# Patient Record
Sex: Male | Born: 1990 | Race: White | Hispanic: No | Marital: Married | Smoking: Current every day smoker
Health system: Southern US, Community
[De-identification: ages and names within clinical notes are randomized; demographics above are authoritative.]

## PROBLEM LIST (undated history)

## (undated) DIAGNOSIS — R569 Unspecified convulsions: Secondary | ICD-10-CM

## (undated) DIAGNOSIS — F329 Major depressive disorder, single episode, unspecified: Secondary | ICD-10-CM

## (undated) DIAGNOSIS — F419 Anxiety disorder, unspecified: Secondary | ICD-10-CM

## (undated) DIAGNOSIS — F1920 Other psychoactive substance dependence, uncomplicated: Secondary | ICD-10-CM

## (undated) DIAGNOSIS — F32A Depression, unspecified: Secondary | ICD-10-CM

## (undated) HISTORY — DX: Depression, unspecified: F32.A

## (undated) HISTORY — DX: Anxiety disorder, unspecified: F41.9

## (undated) HISTORY — DX: Other psychoactive substance dependence, uncomplicated: F19.20

## (undated) HISTORY — DX: Unspecified convulsions: R56.9

## (undated) HISTORY — DX: Major depressive disorder, single episode, unspecified: F32.9

---
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
## 2008-02-19 ENCOUNTER — Ambulatory Visit (HOSPITAL_COMMUNITY): Admission: RE | Admit: 2008-02-19 | Discharge: 2008-02-19 | Payer: Self-pay | Admitting: Family Medicine

## 2009-01-26 ENCOUNTER — Inpatient Hospital Stay (HOSPITAL_COMMUNITY): Admission: EM | Admit: 2009-01-26 | Discharge: 2009-01-28 | Payer: Self-pay | Admitting: Emergency Medicine

## 2010-04-08 ENCOUNTER — Ambulatory Visit (HOSPITAL_COMMUNITY): Admission: RE | Admit: 2010-04-08 | Discharge: 2010-04-08 | Payer: Self-pay | Admitting: Family Medicine

## 2010-07-02 ENCOUNTER — Emergency Department (HOSPITAL_COMMUNITY): Admission: EM | Admit: 2010-07-02 | Discharge: 2010-07-02 | Payer: Self-pay | Admitting: Emergency Medicine

## 2010-10-25 DIAGNOSIS — F1920 Other psychoactive substance dependence, uncomplicated: Secondary | ICD-10-CM

## 2010-10-25 HISTORY — DX: Other psychoactive substance dependence, uncomplicated: F19.20

## 2010-11-27 ENCOUNTER — Emergency Department (HOSPITAL_COMMUNITY)
Admission: EM | Admit: 2010-11-27 | Discharge: 2010-11-27 | Discharge status: Against Medical Advice | Payer: Self-pay | Attending: Emergency Medicine | Admitting: Emergency Medicine

## 2010-11-27 DIAGNOSIS — F1910 Other psychoactive substance abuse, uncomplicated: Secondary | ICD-10-CM | POA: Insufficient documentation

## 2010-11-27 DIAGNOSIS — F329 Major depressive disorder, single episode, unspecified: Secondary | ICD-10-CM | POA: Insufficient documentation

## 2010-11-27 DIAGNOSIS — G40909 Epilepsy, unspecified, not intractable, without status epilepticus: Secondary | ICD-10-CM | POA: Insufficient documentation

## 2010-11-27 DIAGNOSIS — F3289 Other specified depressive episodes: Secondary | ICD-10-CM | POA: Insufficient documentation

## 2010-11-27 DIAGNOSIS — F1110 Opioid abuse, uncomplicated: Secondary | ICD-10-CM | POA: Insufficient documentation

## 2010-11-27 LAB — CBC
MCH: 29.8 pg (ref 26.0–34.0)
MCHC: 34.2 g/dL (ref 30.0–36.0)
Platelets: 269 10*3/uL (ref 150–400)
RBC: 4.63 MIL/uL (ref 4.22–5.81)
RDW: 12.3 % (ref 11.5–15.5)

## 2010-11-27 LAB — RAPID URINE DRUG SCREEN, HOSP PERFORMED
Amphetamines: NOT DETECTED
Barbiturates: NOT DETECTED
Benzodiazepines: POSITIVE — AB
Opiates: POSITIVE — AB

## 2010-11-27 LAB — DIFFERENTIAL
Basophils Relative: 1 % (ref 0–1)
Eosinophils Absolute: 0.5 10*3/uL (ref 0.0–0.7)
Eosinophils Relative: 6 % — ABNORMAL HIGH (ref 0–5)
Monocytes Absolute: 0.6 10*3/uL (ref 0.1–1.0)
Monocytes Relative: 8 % (ref 3–12)
Neutrophils Relative %: 63 % (ref 43–77)

## 2010-11-27 LAB — COMPREHENSIVE METABOLIC PANEL
Albumin: 4.2 g/dL (ref 3.5–5.2)
BUN: 12 mg/dL (ref 6–23)
Creatinine, Ser: 0.88 mg/dL (ref 0.4–1.5)
Total Protein: 7.1 g/dL (ref 6.0–8.3)

## 2010-11-27 LAB — ACETAMINOPHEN LEVEL: Acetaminophen (Tylenol), Serum: <10 ug/mL — ABNORMAL LOW (ref 10–30)

## 2010-11-27 LAB — ETHANOL: Alcohol, Ethyl (B): <5 mg/dL (ref 0–10)

## 2010-11-30 ENCOUNTER — Emergency Department (HOSPITAL_COMMUNITY)
Admission: EM | Admit: 2010-11-30 | Discharge: 2010-12-01 | Disposition: A | Discharge status: Home / Self Care | Payer: Self-pay | Attending: Emergency Medicine | Admitting: Emergency Medicine

## 2010-11-30 DIAGNOSIS — F1110 Opioid abuse, uncomplicated: Secondary | ICD-10-CM | POA: Insufficient documentation

## 2010-11-30 DIAGNOSIS — F17200 Nicotine dependence, unspecified, uncomplicated: Secondary | ICD-10-CM | POA: Insufficient documentation

## 2010-11-30 DIAGNOSIS — G40909 Epilepsy, unspecified, not intractable, without status epilepticus: Secondary | ICD-10-CM | POA: Insufficient documentation

## 2010-11-30 DIAGNOSIS — F1410 Cocaine abuse, uncomplicated: Secondary | ICD-10-CM | POA: Insufficient documentation

## 2010-11-30 LAB — URINALYSIS, ROUTINE W REFLEX MICROSCOPIC
Nitrite: NEGATIVE
Specific Gravity, Urine: 1.042 — ABNORMAL HIGH (ref 1.005–1.030)
Urobilinogen, UA: 0.2 mg/dL (ref 0.0–1.0)
pH: 5.5 (ref 5.0–8.0)

## 2010-11-30 LAB — RAPID URINE DRUG SCREEN, HOSP PERFORMED
Amphetamines: NOT DETECTED
Cocaine: POSITIVE — AB
Tetrahydrocannabinol: NOT DETECTED

## 2010-12-01 LAB — DIFFERENTIAL
Basophils Absolute: 0 10*3/uL (ref 0.0–0.1)
Basophils Relative: 1 % (ref 0–1)
Eosinophils Relative: 5 % (ref 0–5)
Lymphocytes Relative: 24 % (ref 12–46)
Monocytes Absolute: 0.6 10*3/uL (ref 0.1–1.0)

## 2010-12-01 LAB — COMPREHENSIVE METABOLIC PANEL
ALT: 13 U/L (ref 0–53)
AST: 22 U/L (ref 0–37)
Albumin: 4.1 g/dL (ref 3.5–5.2)
Alkaline Phosphatase: 58 U/L (ref 39–117)
Calcium: 9.5 mg/dL (ref 8.4–10.5)
GFR calc Af Amer: >60 mL/min (ref >60)
Glucose, Bld: 113 mg/dL — ABNORMAL HIGH (ref 70–99)
Potassium: 3.6 mEq/L (ref 3.5–5.1)
Sodium: 137 mEq/L (ref 135–145)
Total Protein: 7.2 g/dL (ref 6.0–8.3)

## 2010-12-01 LAB — CBC
HCT: 38.3 % — ABNORMAL LOW (ref 39.0–52.0)
MCHC: 35.2 g/dL (ref 30.0–36.0)
Platelets: 292 10*3/uL (ref 150–400)
RDW: 12.2 % (ref 11.5–15.5)
WBC: 6.3 10*3/uL (ref 4.0–10.5)

## 2010-12-16 ENCOUNTER — Emergency Department (HOSPITAL_COMMUNITY)
Admission: EM | Admit: 2010-12-16 | Discharge: 2010-12-17 | Disposition: A | Discharge status: Home / Self Care | Payer: Self-pay | Attending: Emergency Medicine | Admitting: Emergency Medicine

## 2010-12-16 DIAGNOSIS — F1410 Cocaine abuse, uncomplicated: Secondary | ICD-10-CM | POA: Insufficient documentation

## 2010-12-16 LAB — CBC
HCT: 40.4 % (ref 39.0–52.0)
MCH: 30.3 pg (ref 26.0–34.0)
MCV: 88.6 fL (ref 78.0–100.0)
Platelets: 292 10*3/uL (ref 150–400)
RDW: 12.2 % (ref 11.5–15.5)
WBC: 12.1 10*3/uL — ABNORMAL HIGH (ref 4.0–10.5)

## 2010-12-16 LAB — COMPREHENSIVE METABOLIC PANEL
Albumin: 4.4 g/dL (ref 3.5–5.2)
Alkaline Phosphatase: 53 U/L (ref 39–117)
BUN: 11 mg/dL (ref 6–23)
Chloride: 100 mEq/L (ref 96–112)
Creatinine, Ser: 0.78 mg/dL (ref 0.4–1.5)
Glucose, Bld: 104 mg/dL — ABNORMAL HIGH (ref 70–99)
Potassium: 3 mEq/L — ABNORMAL LOW (ref 3.5–5.1)
Total Bilirubin: 0.5 mg/dL (ref 0.3–1.2)
Total Protein: 7.6 g/dL (ref 6.0–8.3)

## 2010-12-16 LAB — DIFFERENTIAL
Eosinophils Absolute: 0.6 10*3/uL (ref 0.0–0.7)
Eosinophils Relative: 5 % (ref 0–5)
Lymphocytes Relative: 18 % (ref 12–46)
Lymphs Abs: 2.2 10*3/uL (ref 0.7–4.0)
Monocytes Relative: 8 % (ref 3–12)

## 2010-12-16 LAB — ETHANOL: Alcohol, Ethyl (B): <5 mg/dL (ref 0–10)

## 2010-12-16 LAB — RAPID URINE DRUG SCREEN, HOSP PERFORMED
Barbiturates: NOT DETECTED
Opiates: NOT DETECTED

## 2011-02-03 LAB — COMPREHENSIVE METABOLIC PANEL
Alkaline Phosphatase: 69 U/L (ref 39–117)
BUN: 14 mg/dL (ref 6–23)
CO2: 16 mEq/L — ABNORMAL LOW (ref 19–32)
Chloride: 104 mEq/L (ref 96–112)
Creatinine, Ser: 1.05 mg/dL (ref 0.4–1.5)
GFR calc non Af Amer: >60 mL/min (ref >60)
Glucose, Bld: 119 mg/dL — ABNORMAL HIGH (ref 70–99)
Potassium: 3.1 mEq/L — ABNORMAL LOW (ref 3.5–5.1)
Total Bilirubin: 1 mg/dL (ref 0.3–1.2)

## 2011-02-03 LAB — BASIC METABOLIC PANEL
BUN: 8 mg/dL (ref 6–23)
CO2: 25 mEq/L (ref 19–32)
Chloride: 105 mEq/L (ref 96–112)
Glucose, Bld: 107 mg/dL — ABNORMAL HIGH (ref 70–99)
Potassium: 3.9 mEq/L (ref 3.5–5.1)
Sodium: 137 mEq/L (ref 135–145)

## 2011-02-03 LAB — DIFFERENTIAL
Basophils Absolute: 0.1 10*3/uL (ref 0.0–0.1)
Basophils Relative: 0 % (ref 0–1)
Monocytes Absolute: 1.5 10*3/uL — ABNORMAL HIGH (ref 0.1–1.0)
Neutro Abs: 6.6 10*3/uL (ref 1.7–7.7)
Neutrophils Relative %: 50 % (ref 43–77)

## 2011-02-03 LAB — RAPID URINE DRUG SCREEN, HOSP PERFORMED
Amphetamines: NOT DETECTED
Barbiturates: NOT DETECTED
Benzodiazepines: POSITIVE — AB
Cocaine: NOT DETECTED
Opiates: POSITIVE — AB

## 2011-02-03 LAB — URINALYSIS, ROUTINE W REFLEX MICROSCOPIC
Bilirubin Urine: NEGATIVE
Hgb urine dipstick: NEGATIVE
Ketones, ur: NEGATIVE mg/dL
Nitrite: NEGATIVE
pH: 5.5 (ref 5.0–8.0)

## 2011-02-03 LAB — SALICYLATE LEVEL: Salicylate Lvl: <4 mg/dL (ref 2.8–20.0)

## 2011-02-03 LAB — ACETAMINOPHEN LEVEL: Acetaminophen (Tylenol), Serum: <10 ug/mL — ABNORMAL LOW (ref 10–30)

## 2011-02-03 LAB — CBC
HCT: 48.1 % (ref 39.0–52.0)
Hemoglobin: 16.3 g/dL (ref 13.0–17.0)
MCV: 93.1 fL (ref 78.0–100.0)
WBC: 13.3 10*3/uL — ABNORMAL HIGH (ref 4.0–10.5)

## 2011-02-03 LAB — MISCELLANEOUS TEST

## 2011-02-03 LAB — CK: Total CK: 137 U/L (ref 7–232)

## 2011-02-03 LAB — GLUCOSE, CAPILLARY

## 2011-03-09 NOTE — Consult Note (Signed)
NAME:  [name], [name]                ACCOUNT NO.:  [id number]   MEDICAL RECORD NO.:  [id number]          PATIENT TYPE:  INP   LOCATION:  A336                          FACILITY:  [facility]   PHYSICIAN:  Kofi A. Gerilyn Pilgrim, M.D. DATE OF BIRTH:  1991-08-19   DATE OF CONSULTATION:  DATE OF DISCHARGE:                                 CONSULTATION   REASON FOR CONSULTATION:  Altered mental status.   The patient is a 20 year old white male who resides at home with his  grandmother.  The grandmother indicates over the past 2 months he has  been having what appears to be isolated body twitches occurring  episodically involving different parts of the body.  The description  seems to be consistent with twitches, although certainly myoclonic  twitches may be a possibility.  The patient had what appeared to be a  generalization tonic-clonic activity while playing a video game with a  friend last night.  The event was preceded by myoclonic jerking twitches  as he has had over the past 2 months.  He then had generalized shaking  event, probably about one or two events, both at home and on the way to  the emergency room.  He did bite the left side of his tongue.  There is  no family history of seizure or epilepsy.  The patient does not have a  history of CNS infection or head injury.  He was born full-term by  vaginal delivery, although he is mother did use drugs during the  pregnancy.  No history of fetal infection.   PAST MEDICAL HISTORY:  Unremarkable.   MEDICATIONS:  None.   SOCIAL HISTORY:  He resides with his grandmother.  His natural parents  apparently had problems with the law and have been in and out of prison.  He drinks occasionally.  Apparently, no history of drug abuse, although  his urine drug screen is positive for marijuana.  He is a tobacco user.  He smokes cigarettes.  Currently a Consulting civil engineer.   ALLERGIES:  NONE KNOWN.   REVIEW OF SYSTEMS:  As stated in the history of present illness.   The  patient reports significant right shoulder pain.  He injured it during  the seizure.  He also complains of a superficial laceration involving  the right lateral thigh.   PHYSICAL EXAMINATION:  Shows average weight man.  He is no acute  distress.  He is sleeping.  Temperature 97.7, pulse 57, respirations 20, blood pressure 127/64.  HEENT EVALUATION:  There is a healing bruise/bite mark involving the  right side of the tongue.  NECK:  Supple.  ABDOMEN:  Soft.  EXTREMITIES:  No significant edema.  There is a superficial laceration  involving the lateral aspect of the right thigh.  MENTATION:  The patient is sleeping, but he easily arousable.  He is  awake and alert.  He is converses well.  Speech, language and cognition  are intact.  CRANIAL NERVE EVALUATION:  Pupils are large 7 mm, but briskly reactive.  Visual fields are intact.  Extraocular movements are full.  Facial  muscle  strength is symmetric.  Tongue midline.  Uvula midline.  Shoulder  shrugs, slightly reduced on the right due to pain.  MOTOR EXAMINATION:  Shows the proximal weakness of the right upper  extremity, the right shoulder again, mostly limited to pain.  The other  muscle groups involving the other extremities shows normal tone, bulk  and strength.  Coordination shows no dysmetria, no tremors, no passed  pointing.  The reflexes are preserved.  Plantar reflexes are both  downgoing.  Sensation is normal to temperature and light touch.   Head CT scan of brain is normal.  Urine drug screen is positive for  opioids, benzodiazepines and cannabinoid.  Sodium 143, potassium 3.1,  chloride 104, CO2 of 60, glucose 119, BUN 14, creatinine 1, calcium 10.  Liver enzymes are normal.  CPK 137.  Acetaminophen, salicylates both  undetected.  Alcohol undetected.  Lactic acid normal range of 1.7.  WBC  13.3, platelet count 281, hemoglobin 16.  The differential 50%  neutrophils, monocytes 11%, eosinophils are slightly high at  1.2,  basophils 9.   IMPRESSION:  New onset of seizures.  Given the recurrent events of  myoclonic jerk, I suspect that the patient may turn out to be epileptic  and may need long-term antiepileptic medication.  The myoclonic jerk  raises the possibility of diurnal myoclonic epilepsy syndrome.  The EEG  will help determine this.  If this is the case, then we will most likely  need to which his medication to a more appropriate therapy such as  Depakote.  The urine drug screen positive for benzodiazepine also raises  the possibility of benzodiazepine withdrawal seizures.   RECOMMENDATIONS:  1. MRI of brain.  2. EEG.  3. Further recommendations will follow depending on the workup,      particularly the EEG.  We will decide if his Dilantin is indeed the      most appropriate medication which has been started.   Thank you for this consultation.      Kofi A. Gerilyn Pilgrim, M.D.  Electronically Signed     KAD/MEDQ  D:  01/27/2009  T:  01/27/2009  Job:  [id number]

## 2011-03-09 NOTE — Group Therapy Note (Signed)
NAME:  [name], KEETCH                ACCOUNT NO.:  [id number]   MEDICAL RECORD NO.:  [id number]          PATIENT TYPE:  INP   LOCATION:  A336                          FACILITY:  [facility]   PHYSICIAN:  Kofi A. Gerilyn Pilgrim, M.D. DATE OF BIRTH:  05/13/1991   DATE OF PROCEDURE:  DATE OF DISCHARGE:  01/28/2009                                 PROGRESS NOTE   The patient continues to have myoclonic jerks.  Temperature 97.3, pulse  55, respirations 20, blood pressure 120/75.  He is awake, alert, and  feels hot.  He still complains of significant left shoulder soreness.  MRI of the brain was reviewed in person and shows no significant  mesiotemporal sclerosis, otherwise unrevealing.  EEG has not been done.   ASSESSMENT AND PLAN:  Likely new-onset seizure with increased risk of  recurrent events.  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  He has been placed on Dilantin.  For now,  we will continue this until EEG has been obtained.  Seizure precaution  was discussed at length with the patient.      Kofi A. Gerilyn Pilgrim, M.D.  Electronically Signed     KAD/MEDQ  D:  01/28/2009  T:  01/29/2009  Job:  [id number]

## 2011-03-09 NOTE — H&P (Signed)
[name], [name]                ACCOUNT NO.:  [id number]   MEDICAL RECORD NO.:  [id number]          PATIENT TYPE:  INP   LOCATION:  A336                          FACILITY:  [facility]   PHYSICIAN:  Donna Bernard, M.D.DATE OF BIRTH:  1991-04-06   DATE OF ADMISSION:  01/26/2009  DATE OF DISCHARGE:  LH                              HISTORY & PHYSICAL   CHIEF COMPLAINT:  Possible seizure.   SUBJECTIVE:  This patient is a 20-1/22-year-[age] male with a history of an  acute event and brought into the emergency room.  He was walking in the  house when his legs seems to give away and knee collapsed towards the  floor and he slowly picked himself up, and a couple minutes later he  collapsed again, he started speaking in a bit of slurred speech.  He  seemed to be a bit disoriented, then he proceeded to go unconscious.  At  this time, his arms and legs were started jerking.  He bit his tongue.  He did not urinate on himself, relatives threw him in a vehicle and  brought him to the emergency room.  He was still seizing.  When he  arrived to the ER, he was given IV Ativan.  He developed postictal  confusion and mental status changes and hypersomnia, which gradually  resolved over 20-30 minutes post event.  The patient has no personal  history of seizures.  There was some history of drug abuse with mother  in the past.   CURRENT MEDICATIONS:  None.   SOCIAL HISTORY:  The patient is single, lives at home, and did get his  degree.  Currently, he is not driving secondary to DUI.  Currently, he  is not working.  He does smoke cigarettes.  He admits to no drug use,  however, when we want him know there is drug screen that was positive in  multiple regards, he backed up a bit on this.   PRIOR HOSPITALIZATIONS:  No prior hospitalizations.   PRIOR SURGERIES:  No prior surgeries.   PRIOR MEDICAL HISTORY:  Significant for depression, anxiety, and for  ADHD.   REVIEW OF SYSTEMS:  Otherwise  negative.   PHYSICAL EXAMINATION:  VITAL SIGNS:  Blood pressure 118/70, afebrile.  GENERAL:  The patient is alert, no acute distress, some aching and pain  when he moves his extremities about.  HEENT:  Right lateral tongue has been bit upon.  Fundi disc sharp.  TMs  normal.  NECK:  Supple.  LUNGS:  Clear.  HEART:  Regular rhythm  EXTREMITIES:  Shoulders pain with rotation bilaterally right greater  than left.  ABDOMEN:  Benign.  No rebound or guarding.  NEUROLOGIC:  Intact.  There is occasional twitching of one arm or the  other during the exam.  Then history, family notes the patient has had  some twitching of extremities intermittently in recent months.   Significant labs, CT scan of the brain was negative.  Alcohol level,  salicylate level, and acetaminophen level negative.  Cardiac enzymes  normal.  Urine drug screen, however, was positive for opiates,  marijuana, and benzodiazepines.   IMPRESSION:  1. Probable seizure.  2. Drug abuse.  Discussed with family.  3. History of depression and anxiety.  The patient states he has been      feeling down.  No suicidal thoughts, but admits to feeling down of      late.  4. Hypokalemia.   PLAN:  Admit for evaluation, Neurology consult, IV fluids.  Further  orders as noted in the chart.  We will go ahead and load orally with  Dilantin due to neurological symptomatology in the last several months.      Donna Bernard, M.D.  Electronically Signed     WSL/MEDQ  D:  01/27/2009  T:  01/28/2009  Job:  [id number]

## 2011-03-12 NOTE — Discharge Summary (Signed)
NAMEORAN, DILLENBURG                ACCOUNT NO.:  [id number]   MEDICAL RECORD NO.:  [id number]          PATIENT TYPE:  INP   LOCATION:  A336                          FACILITY:  [facility]   PHYSICIAN:  Donna Bernard, M.D.DATE OF BIRTH:  1991-07-23   DATE OF ADMISSION:  01/26/2009  DATE OF DISCHARGE:  04/06/2010LH                               DISCHARGE SUMMARY   FINAL DIAGNOSES:  1. Seizure disorder.  2. History of depression.  3. History of attention deficit hyperactivity disorder.  4. Hypokalemia.   FINAL DISPOSITION:  1. Patient discharged to home.  2. Patient to follow up with outpatient EEG.  3. Discharge meds Celexa 20 mg daily and Dilantin 100 mg capsules 3      each morning.  4. Follow up in our office in a week and next followup with      neurologist as scheduled.   Please see H and P as dictated.   HOSPITAL COURSE:  This patient is a 37-1/2-year-old male with history of  an event which lasted 20-30 minutes including shaking and jerking.  The  patient collapsed with this.  He was disoriented.  He bit himself on the  tongue.  He was given Ativan upon arrival to the emergency room.  On  further history, he has had twitching in the arms and legs  intermittently in recent months.  The patient was admitted to the  hospital.  He was given an oral accelerated Dilantin dosaging.  A non-  urgent Neurology consult was obtained.  Neurologist felt the patient may  well be experiencing myoclonic epilepsy based on this.  Dr. Gerilyn Pilgrim  recommended an MRI of the brain and EEG.  These were to be done on  outpatient basis.  Of note, the patient had CT scan of the head which  was benign.  It should also be noted, his urine drug screen was positive  for benzodiazepines, marijuana, and opiates.  The patient admitted to  some use of these.  The patient was discharged home on day of discharge  with diagnosis and disposition as noted above.      Donna Bernard, M.D.  Electronically  Signed     WSL/MEDQ  D:  03/17/2009  T:  03/18/2009  Job:  [id number]

## 2013-01-03 ENCOUNTER — Other Ambulatory Visit: Payer: Self-pay | Admitting: Family Medicine

## 2013-01-03 DIAGNOSIS — R109 Unspecified abdominal pain: Secondary | ICD-10-CM

## 2013-01-05 ENCOUNTER — Ambulatory Visit (HOSPITAL_COMMUNITY)
Admission: RE | Admit: 2013-01-05 | Discharge: 2013-01-05 | Disposition: A | Discharge status: Home / Self Care | Payer: No Typology Code available for payment source | Source: Ambulatory Visit | Attending: Family Medicine | Admitting: Family Medicine

## 2013-01-05 DIAGNOSIS — R109 Unspecified abdominal pain: Secondary | ICD-10-CM | POA: Insufficient documentation

## 2013-01-06 ENCOUNTER — Encounter: Payer: Self-pay | Admitting: *Deleted

## 2013-01-15 ENCOUNTER — Other Ambulatory Visit (INDEPENDENT_AMBULATORY_CARE_PROVIDER_SITE_OTHER): Payer: Self-pay | Admitting: *Deleted

## 2013-01-15 ENCOUNTER — Encounter (INDEPENDENT_AMBULATORY_CARE_PROVIDER_SITE_OTHER): Payer: Self-pay | Admitting: *Deleted

## 2013-01-15 ENCOUNTER — Ambulatory Visit (INDEPENDENT_AMBULATORY_CARE_PROVIDER_SITE_OTHER): Payer: No Typology Code available for payment source | Admitting: Internal Medicine

## 2013-01-15 ENCOUNTER — Encounter (INDEPENDENT_AMBULATORY_CARE_PROVIDER_SITE_OTHER): Payer: Self-pay | Admitting: Internal Medicine

## 2013-01-15 ENCOUNTER — Encounter (HOSPITAL_COMMUNITY): Payer: Self-pay | Admitting: Pharmacy Technician

## 2013-01-15 VITALS — BP 104/65 | HR 64 | Temp 98.2°F | Ht 76.0 in | Wt 152.2 lb

## 2013-01-15 DIAGNOSIS — F4323 Adjustment disorder with mixed anxiety and depressed mood: Secondary | ICD-10-CM | POA: Insufficient documentation

## 2013-01-15 DIAGNOSIS — K219 Gastro-esophageal reflux disease without esophagitis: Secondary | ICD-10-CM

## 2013-01-15 DIAGNOSIS — R634 Abnormal weight loss: Secondary | ICD-10-CM

## 2013-01-15 DIAGNOSIS — F329 Major depressive disorder, single episode, unspecified: Secondary | ICD-10-CM | POA: Insufficient documentation

## 2013-01-15 DIAGNOSIS — F411 Generalized anxiety disorder: Secondary | ICD-10-CM | POA: Insufficient documentation

## 2013-01-15 LAB — CBC WITH DIFFERENTIAL/PLATELET
Eosinophils Absolute: 0.1 10*3/uL (ref 0.0–0.7)
Eosinophils Relative: 2 % (ref 0–5)
HCT: 40.8 % (ref 39.0–52.0)
Hemoglobin: 14.1 g/dL (ref 13.0–17.0)
Lymphocytes Relative: 20 % (ref 12–46)
Lymphs Abs: 1.1 10*3/uL (ref 0.7–4.0)
MCH: 29.3 pg (ref 26.0–34.0)
MCV: 84.6 fL (ref 78.0–100.0)
Monocytes Relative: 7 % (ref 3–12)
RBC: 4.82 MIL/uL (ref 4.22–5.81)
WBC: 5.6 10*3/uL (ref 4.0–10.5)

## 2013-01-15 NOTE — Progress Notes (Addendum)
Subjective:     Patient ID: [name], male   DOB: [birth year], 22 y.o.   MRN: [id number]  HPI Referred to our office by Dr. Lubertha South for  upper abdominal pain. Pain x 1 1/2 months. Says he has been losing weight for 3 months. Usual weight is 230. Today his weight is 152.2lb. He has lost 9 pound since his office visit with DR. Gerda Diss,  March 12,2014.  On 07/24/12 weight was 184.4lbs He describes the pain as starting in his rt flank and then radiated in his rt abdomen and epigastric region.  Saw in Dr. Gerda Diss office March 12 an given RX for GI cocktail and Protonix.  He says the GI cocktail helped his acid reflux. Protonix has helped also, but he says he is still having pain. Appetite is not good.  If he eats he will have diarrhea. Food feels like it is eating his "insides" up. He says he feels weak. He has a BM about every day . No bright rectal bleeding or melena. Stools are brown to greenish in color.    01/05/2013 US abdomen  Gallbladder: No gallstones, gallbladder wall thickening, or  pericholecystic fluid. Negative sonographic Murphy's sign.  Common bile duct: Normal. 6 mm in diameter.  Liver: Normal.  IVC: Normal.  Pancreas: Normal.  Spleen: Normal. 9.6 cm in length.  Right Kidney: Normal. 10.5 cm in length.  Left Kidney: Normal. 10.7 cm in length.  Abdominal aorta: Normal. 1.8 cm maximal diameter.  IMPRESSION:  Negative abdominal ultrasound.   01/03/2013 H and H WBC 4.20,  12.7 and 37.2, MCV 88.6, EOS %8, total bili 0.3, Direct bili 0.1, Indirect 0.2, ALP 44, AST 17, ALT 10.  Review of Systems see hpi Current Outpatient Prescriptions  Medication Sig Dispense Refill  . acetaminophen (TYLENOL) 325 MG tablet Take 650 mg by mouth every 6 (six) hours as needed for pain.      Marland Kitchen Alum & Mag Hydroxide-Simeth (GI COCKTAIL) SUSP suspension Take 30 mLs by mouth 2 (two) times daily. Shake well.      . bismuth subsalicylate (PEPTO BISMOL) 262 MG chewable tablet Chew 524 mg by mouth as  needed for indigestion.      . citalopram (CELEXA) 20 MG tablet Take 20 mg by mouth daily.      . pantoprazole (PROTONIX) 40 MG tablet Take 40 mg by mouth daily.       No current facility-administered medications for this visit.   Past Medical History  Diagnosis Date  . Anxiety   . Depression   . Drug addiction 2012  . Seizures    History reviewed. No pertinent past surgical history. Allergies  Allergen Reactions  . Zithromax (Azithromycin) Other (See Comments)    Hands swelling  . Penicillins Itching and Rash        Objective:   Physical Exam  Filed Vitals:   01/15/13 0944  BP: 104/65  Pulse: 64  Temp: 98.2 F (36.8 C)  Height: 6\' 4"  (1.93 m)  Weight: 152 lb 3.2 oz (69.037 kg)   Alert and oriented. Skin warm and dry. Oral mucosa is moist.   . Sclera anicteric, conjunctivae is pink. Thyroid not enlarged. No cervical lymphadenopathy. Lungs clear. Heart regular rate and rhythm.  Abdomen is soft. Bowel sounds are positive. No hepatomegaly. No abdominal masses felt. Epigastric tenderness.  No edema to lower extremities.       Assessment:    GERD, uncontrolled at this time. PUD needs to be ruled out.  Patient also has had weight loss.    Plan:    EGD with Dr Karilyn Cota.

## 2013-01-15 NOTE — Patient Instructions (Addendum)
EGD with Dr. Rehman. The risks and benefits such as perforation, bleeding, and infection were reviewed with the patient and is agreeable. 

## 2013-01-16 ENCOUNTER — Encounter (INDEPENDENT_AMBULATORY_CARE_PROVIDER_SITE_OTHER): Payer: Self-pay

## 2013-01-18 ENCOUNTER — Telehealth: Payer: Self-pay | Admitting: Nurse Practitioner

## 2013-01-18 NOTE — Telephone Encounter (Signed)
Patient has an appt for Friday with you, but he has an appointment with the hospital for EGD on Monday. [name] wants to know if you want to see him after that instead of on Monday. Please advise ASAP so we can get his appointment cancelled if necessary.

## 2013-01-18 NOTE — Telephone Encounter (Signed)
Please cancel Friday appt since he is going for EGD Monday.

## 2013-01-19 ENCOUNTER — Ambulatory Visit: Payer: Self-pay | Admitting: Nurse Practitioner

## 2013-01-24 ENCOUNTER — Encounter (HOSPITAL_COMMUNITY)
Admission: RE | Disposition: A | Discharge status: Home / Self Care | Payer: Self-pay | Source: Ambulatory Visit | Attending: Internal Medicine

## 2013-01-24 ENCOUNTER — Encounter (HOSPITAL_COMMUNITY): Payer: Self-pay | Admitting: *Deleted

## 2013-01-24 ENCOUNTER — Ambulatory Visit (HOSPITAL_COMMUNITY)
Admission: RE | Admit: 2013-01-24 | Discharge: 2013-01-24 | Disposition: A | Discharge status: Home / Self Care | Payer: No Typology Code available for payment source | Source: Ambulatory Visit | Attending: Internal Medicine | Admitting: Internal Medicine

## 2013-01-24 DIAGNOSIS — K219 Gastro-esophageal reflux disease without esophagitis: Secondary | ICD-10-CM

## 2013-01-24 DIAGNOSIS — R630 Anorexia: Secondary | ICD-10-CM

## 2013-01-24 DIAGNOSIS — R109 Unspecified abdominal pain: Secondary | ICD-10-CM

## 2013-01-24 DIAGNOSIS — R1110 Vomiting, unspecified: Secondary | ICD-10-CM | POA: Insufficient documentation

## 2013-01-24 DIAGNOSIS — R634 Abnormal weight loss: Secondary | ICD-10-CM | POA: Insufficient documentation

## 2013-01-24 HISTORY — PX: ESOPHAGOGASTRODUODENOSCOPY: SHX5428

## 2013-01-24 SURGERY — EGD (ESOPHAGOGASTRODUODENOSCOPY)
Anesthesia: Moderate Sedation

## 2013-01-24 MED ORDER — SIMETHICONE 40 MG/0.6ML PO SUSP
ORAL | Status: DC | PRN
  Administered 2013-01-24: 14:00:00

## 2013-01-24 MED ORDER — MEPERIDINE HCL 25 MG/ML IJ SOLN
INTRAMUSCULAR | Status: DC | PRN
  Administered 2013-01-24 (×2): 25 mg via INTRAVENOUS

## 2013-01-24 MED ORDER — MIDAZOLAM HCL 5 MG/5ML IJ SOLN
INTRAMUSCULAR | Status: AC
  Filled 2013-01-24: qty 5

## 2013-01-24 MED ORDER — SODIUM CHLORIDE 0.9 % IV SOLN
INTRAVENOUS | Status: DC
  Administered 2013-01-24: 14:00:00 via INTRAVENOUS

## 2013-01-24 MED ORDER — BUTAMBEN-TETRACAINE-BENZOCAINE 2-2-14 % EX AERO
INHALATION_SPRAY | CUTANEOUS | Status: DC | PRN
  Administered 2013-01-24: 2 via TOPICAL

## 2013-01-24 MED ORDER — MIDAZOLAM HCL 5 MG/5ML IJ SOLN
INTRAMUSCULAR | Status: AC
  Filled 2013-01-24: qty 10

## 2013-01-24 MED ORDER — MEPERIDINE HCL 50 MG/ML IJ SOLN
INTRAMUSCULAR | Status: AC
  Filled 2013-01-24: qty 1

## 2013-01-24 MED ORDER — MIDAZOLAM HCL 5 MG/5ML IJ SOLN
INTRAMUSCULAR | Status: DC | PRN
  Administered 2013-01-24 (×2): 3 mg via INTRAVENOUS
  Administered 2013-01-24 (×2): 2 mg via INTRAVENOUS

## 2013-01-24 NOTE — H&P (Addendum)
[name] is an 22 y.o. male.   Chief Complaint: Patient is here for diagnostic EGD. HPI: Patient is 22 year old Caucasian male who presents with 3-4 month history of weight loss anorexia postprandial upper and lower abdominal pain. He also complains of intermittent diarrhea. Patient states she has lost close to 80 pounds. He had single episode of hematochezia been noted scant amount of blood and tissue. He has not had any emesis. He has had night sweats but no fever or chills. He complains of being nervous. He was given Celexa but it made her more nervous. He has history of abuse in the past but denies using drugs now. He says his postprandial heartburn has improved with PPI but he still cannot eat.Marland Kitchen He does not take OTC NSAIDs. CBC and comprehensive chemistry panel has been unremarkable. H. pylori serology was noted. He also had upper abdominal ultrasound on 01/05/2013 which was within normal limits.  Past Medical History  Diagnosis Date  . Anxiety   . Depression   . Drug addiction 2012  . Seizures     History reviewed. No pertinent past surgical history.  History reviewed. No pertinent family history. Social History:  reports that he has been smoking Cigarettes.  He has a 2.25 pack-year smoking history. He does not have any smokeless tobacco history on file. He reports that  drinks alcohol. He reports that he uses illicit drugs.  Allergies:  Allergies  Allergen Reactions  . Zithromax (Azithromycin) Other (See Comments)    Hands swelling  . Penicillins Itching and Rash    Medications Prior to Admission  Medication Sig Dispense Refill  . acetaminophen (TYLENOL) 325 MG tablet Take 650 mg by mouth every 6 (six) hours as needed for pain.      Marland Kitchen Alum & Mag Hydroxide-Simeth (GI COCKTAIL) SUSP suspension Take 30 mLs by mouth 2 (two) times daily. Shake well.      . bismuth subsalicylate (PEPTO BISMOL) 262 MG chewable tablet Chew 524 mg by mouth as needed for indigestion.      .  citalopram (CELEXA) 20 MG tablet Take 20 mg by mouth daily.      . pantoprazole (PROTONIX) 40 MG tablet Take 40 mg by mouth daily.        No results found for this or any previous visit (from the past 48 hour(s)). No results found.  ROS  Blood pressure 118/63, temperature 98.1 F (36.7 C), temperature source Oral, resp. rate 14, height 6\' 4"  (1.93 m), weight 152 lb (68.947 kg), SpO2 96.00%. Physical Exam  Constitutional:  Well developed thin Caucasian male in NAD  HENT:  Mouth/Throat: Oropharynx is clear and moist.  Eyes: Conjunctivae are normal. No scleral icterus.  Neck: No thyromegaly present.  Cardiovascular: Normal rate, regular rhythm and normal heart sounds.   No murmur heard. Respiratory: Effort normal and breath sounds normal.  GI: Soft. He exhibits no distension and no mass. Tenderness: mild tenderness at epigastrium and right lower quadrant.  Musculoskeletal: He exhibits no edema.  No clubbing  Lymphadenopathy:    He has no cervical adenopathy.  Neurological: He is alert.  Skin: Skin is warm and dry.     Assessment/Plan Abdominal pain anorexia and massive weight loss. Intermittent diarrhea. Diagnostic EGD.  REHMAN,NAJEEB U 01/24/2013, 2:45 PM

## 2013-01-24 NOTE — Op Note (Signed)
EGD PROCEDURE REPORT  PATIENT:  [name]  MR#:  [id number] Birthdate:  August 08, 1991, 22 y.o., male Endoscopist:  Dr. Malissa Hippo, MD Referred By:  Dr. Vilinda Blanks. Gerda Diss, MD  Procedure Date: 01/24/2013  Procedure:   EGD  Indications:  Patient is 22 year old Caucasian male who presents with abdominal pain postprandial regurgitation anorexia and 80 pound weight loss. CBC was unremarkable except mild eosinophilia. History panel and lipase levels have been normal. Similarly ultrasound of upper abdomen is negative. He is undergoing diagnostic EGD. He has history of IV drug use but denies recent use.            Informed Consent:  The risks, benefits, alternatives & imponderables which include, but are not limited to, bleeding, infection, perforation, drug reaction and potential missed lesion have been reviewed.  The potential for biopsy, lesion removal, esophageal dilation, etc. have also been discussed.  Questions have been answered.  All parties agreeable.  Please see history & physical in medical record for more information.  Medications:  Demerol 50 mg IV Versed 10 mg IV Cetacaine spray topically for oropharyngeal anesthesia  Description of procedure:  The endoscope was introduced through the mouth and advanced to the second portion of the duodenum without difficulty or limitations. The mucosal surfaces were surveyed very carefully during advancement of the scope and upon withdrawal.  Findings:  Esophagus:  Normal mucosa of the esophagus. GE junction was unremarkable. GEJ:  41 cm Stomach:  Stomach was empty and distended very well insufflation. Folds in the proximal stomach are normal. Examination mucosa at body, antrum, pyloric channel, angularis fundus and cardia was normal. Duodenum:  Normal bulbar and post bulbar mucosa.  Therapeutic/Diagnostic Maneuvers Performed:  Random biopsies taken from post bulbar duodenal mucosa for routine histology.  Complications:   None  Impression: Normal esophagogastroduodenoscopy. Random biopsies taken from post ulnar duodenal mucosa.  Recommendations:  Patient will continue antireflux therapy. Will check sedimentation rate, TSH and cortisol levels today. Patient advised to lie prone after meals to see if it helps prevent his pain and regurgitation( ? SMA syndrome). I will be contacting patient with results of blood work and biopsy and further recommendations. Patient advised to make an appointment to see Dr. Lubertha South for management of his anxiety.  REHMAN,NAJEEB U  01/24/2013  3:14 PM  CC: Dr. Harlow Asa, MD & Dr. Bonnetta Barry ref. provider found

## 2013-01-29 ENCOUNTER — Encounter (INDEPENDENT_AMBULATORY_CARE_PROVIDER_SITE_OTHER): Payer: Self-pay | Admitting: *Deleted

## 2013-01-29 ENCOUNTER — Encounter (HOSPITAL_COMMUNITY): Payer: Self-pay | Admitting: Internal Medicine

## 2013-01-31 ENCOUNTER — Ambulatory Visit: Payer: Self-pay | Admitting: Nurse Practitioner

## 2013-02-07 ENCOUNTER — Ambulatory Visit (INDEPENDENT_AMBULATORY_CARE_PROVIDER_SITE_OTHER): Payer: No Typology Code available for payment source | Admitting: Nurse Practitioner

## 2013-02-07 ENCOUNTER — Encounter: Payer: Self-pay | Admitting: Nurse Practitioner

## 2013-02-07 VITALS — BP 114/70 | Ht 72.0 in | Wt 155.6 lb

## 2013-02-07 DIAGNOSIS — F411 Generalized anxiety disorder: Secondary | ICD-10-CM

## 2013-02-07 DIAGNOSIS — F5105 Insomnia due to other mental disorder: Secondary | ICD-10-CM

## 2013-02-07 DIAGNOSIS — F489 Nonpsychotic mental disorder, unspecified: Secondary | ICD-10-CM

## 2013-02-07 MED ORDER — CITALOPRAM HYDROBROMIDE 20 MG PO TABS: ORAL_TABLET | ORAL | Status: DC

## 2013-02-08 ENCOUNTER — Encounter: Payer: Self-pay | Admitting: Nurse Practitioner

## 2013-02-08 NOTE — Assessment & Plan Note (Signed)
Discussed options. Explained to patient he needs be consistent with his dose of Celexa every day, feel that some of the agitation and shakiness is coming from varying his dose from day to day with fluctuation of the serotonin levels. Increase Celexa to one and a half tabs by mouth each bedtime. Followup with mental health as planned. Call back sooner if any problems.

## 2013-02-08 NOTE — Progress Notes (Signed)
Subjective:  Presents for recheck. Has an appointment with mental health within the next week. Having significant problems with sleep disturbance. Currently on Celexa 20 mg. Has very to his dose from anywhere from none to a half to one per day. Has noticed some agitation and shakiness at times. No other adverse effects. Continues to struggle with his weight. Has had a complete GI workup which was normal. Reflux is doing much better.  Objective:   BP 114/70  Ht 6' (1.829 m)  Wt 155 lb 9.6 oz (70.58 kg)  BMI 21.1 kg/m2 NAD. Alert, oriented. Mildly anxious affect. Much improved from previous visit. Much calmer. Lungs clear. Heart regular rate rhythm.

## 2013-03-13 ENCOUNTER — Ambulatory Visit (INDEPENDENT_AMBULATORY_CARE_PROVIDER_SITE_OTHER): Payer: No Typology Code available for payment source | Admitting: Nurse Practitioner

## 2013-03-13 ENCOUNTER — Encounter: Payer: Self-pay | Admitting: Nurse Practitioner

## 2013-03-13 VITALS — BP 110/68 | Temp 98.9°F | Ht 73.0 in | Wt 143.4 lb

## 2013-03-13 DIAGNOSIS — K2970 Gastritis, unspecified, without bleeding: Secondary | ICD-10-CM

## 2013-03-13 DIAGNOSIS — K219 Gastro-esophageal reflux disease without esophagitis: Secondary | ICD-10-CM

## 2013-03-13 DIAGNOSIS — K644 Residual hemorrhoidal skin tags: Secondary | ICD-10-CM

## 2013-03-13 DIAGNOSIS — R634 Abnormal weight loss: Secondary | ICD-10-CM

## 2013-03-13 DIAGNOSIS — K2990 Gastroduodenitis, unspecified, without bleeding: Secondary | ICD-10-CM

## 2013-03-13 MED ORDER — SUCRALFATE 1 G PO TABS: ORAL_TABLET | ORAL | Status: DC

## 2013-03-13 MED ORDER — HYDROCORTISONE ACE-PRAMOXINE 1-1 % RE FOAM: 1.0000 | Freq: Two times a day (BID) | RECTAL | Status: DC

## 2013-03-13 NOTE — Patient Instructions (Signed)
   Hemorrhoids  Hemorrhoids are enlarged (dilated) veins around the rectum. There are 2 types of hemorrhoids, and the type of hemorrhoid is determined by its location. Internal hemorrhoids occur in the veins just inside the rectum.They are usually not painful, but they may bleed.However, they may poke through to the outside and become irritated and painful. External hemorrhoids involve the veins outside the anus and can be felt as a painful swelling or hard lump near the anus.They are often itchy and may crack and bleed. Sometimes clots will form in the veins. This makes them swollen and painful. These are called thrombosed hemorrhoids.  CAUSES  Causes of hemorrhoids include:   Pregnancy. This increases the pressure in the hemorrhoidal veins.   Constipation.   Straining to have a bowel movement.   Obesity.   Heavy lifting or other activity that caused you to strain.  TREATMENT  Most of the time hemorrhoids improve in 1 to 2 weeks. However, if symptoms do not seem to be getting better or if you have a lot of rectal bleeding, your caregiver may perform a procedure to help make the hemorrhoids get smaller or remove them completely.Possible treatments include:   Rubber band ligation. A rubber band is placed at the base of the hemorrhoid to cut off the circulation.   Sclerotherapy. A chemical is injected to shrink the hemorrhoid.   Infrared light therapy. Tools are used to burn the hemorrhoid.   Hemorrhoidectomy. This is surgical removal of the hemorrhoid.  HOME CARE INSTRUCTIONS    Increase fiber in your diet. Ask your caregiver about using fiber supplements.   Drink enough water and fluids to keep your urine clear or pale yellow.   Exercise regularly.   Go to the bathroom when you have the urge to have a bowel movement. Do not wait.   Avoid straining to have bowel movements.   Keep the anal area dry and clean.   Only take over-the-counter or prescription medicines for pain, discomfort, or fever as  directed by your caregiver.  If your hemorrhoids are thrombosed:   Take warm sitz baths for 20 to 30 minutes, 3 to 4 times per day.   If the hemorrhoids are very tender and swollen, place ice packs on the area as tolerated. Using ice packs between sitz baths may be helpful. Fill a plastic bag with ice. Place a towel between the bag of ice and your skin.   Medicated creams and suppositories may be used or applied as directed.   Do not use a donut-shaped pillow or sit on the toilet for long periods. This increases blood pooling and pain.  SEEK MEDICAL CARE IF:    You have increasing pain and swelling that is not controlled with your medicine.   You have uncontrolled bleeding.   You have difficulty or you are unable to have a bowel movement.   You have pain or inflammation outside the area of the hemorrhoids.   You have chills or an oral temperature above 102 F (38.9 C).  MAKE SURE YOU:    Understand these instructions.   Will watch your condition.   Will get help right away if you are not doing well or get worse.  Document Released: 10/08/2000 Document Revised: 01/03/2012 Document Reviewed: 09/21/2010  ExitCare Patient Information 2013 [employer].

## 2013-03-14 ENCOUNTER — Encounter: Payer: Self-pay | Admitting: Nurse Practitioner

## 2013-03-14 DIAGNOSIS — R634 Abnormal weight loss: Secondary | ICD-10-CM | POA: Insufficient documentation

## 2013-03-14 NOTE — Assessment & Plan Note (Signed)
Recently had GI work-up including endoscopy. Continue Protonix.  Add Carafate as directed.  Follow-up with mental health next week for anxiety.

## 2013-03-14 NOTE — Assessment & Plan Note (Signed)
Recently had GI work-up including endoscopy. Continue Protonix.  Add Carafate as directed.  Follow-up with mental health next week for anxiety.  

## 2013-03-14 NOTE — Progress Notes (Signed)
Subjective:  Presents for c/o hemorrhoids for the past week.  Rectal pain. Pain with defecation.  Slight bright red bleeding today.  Minimal relief with OTC hemorrhoid cream he used yesterday. Mild occasional lower abdominal pain.  Having BM almost every day.  Denies constipation or diarrhea.  Continues to lose weight.  Some breakthrough acid reflux.  Recently had GI work-up including endoscopy.  Has significant anxiety.  Has appointment with [facility] next week.    Objective:   BP 110/68  Temp(Src) 98.9 F (37.2 C) (Oral)  Ht 6\' 1"  (1.854 m)  Wt 143 lb 6 oz (65.034 kg)  BMI 18.92 kg/m2 NAD. Alert, Oriented. Has lost approx. 12 lbs since last visit in April.  Lungs clear.  Heart RRR.  Abdomen soft, nondistended, minimal generalized lower abdominal tenderness on exam.  No masses. Distinct tenderness in epigastric area. Small pink very tender external hemorrhoid noted.  Assessment:External hemorrhoid  Gastritis  Plan:  Continue Protonix as directed. Meds ordered this encounter  Medications  . sucralfate (CARAFATE) 1 G tablet    Sig: One po 30 min before meals and at bedtime as needed (max 4 per day)    Dispense:  40 tablet    Refill:  0    Order Specific Question:  Supervising Provider    Answer:  Merlyn Albert [[id number]]  . hydrocortisone-pramoxine (PROCTOFOAM-HC) rectal foam    Sig: Place 1 applicator rectally 2 (two) times daily.    Dispense:  10 g    Refill:  0    Order Specific Question:  Supervising Provider    Answer:  Merlyn Albert [[id number]]   Call back by end of week if no improvement.  Follow-up with mental health as planned.

## 2013-06-22 ENCOUNTER — Encounter (INDEPENDENT_AMBULATORY_CARE_PROVIDER_SITE_OTHER): Payer: Self-pay | Admitting: *Deleted

## 2013-07-09 ENCOUNTER — Ambulatory Visit (INDEPENDENT_AMBULATORY_CARE_PROVIDER_SITE_OTHER): Payer: No Typology Code available for payment source | Admitting: Internal Medicine

## 2013-11-05 ENCOUNTER — Ambulatory Visit (INDEPENDENT_AMBULATORY_CARE_PROVIDER_SITE_OTHER): Payer: No Typology Code available for payment source | Admitting: Internal Medicine

## 2013-11-20 ENCOUNTER — Encounter: Payer: Self-pay | Admitting: Nurse Practitioner

## 2013-11-20 ENCOUNTER — Ambulatory Visit (INDEPENDENT_AMBULATORY_CARE_PROVIDER_SITE_OTHER): Payer: No Typology Code available for payment source | Admitting: Nurse Practitioner

## 2013-11-20 VITALS — BP 126/80 | Ht 73.0 in | Wt 167.2 lb

## 2013-11-20 DIAGNOSIS — IMO0002 Reserved for concepts with insufficient information to code with codable children: Secondary | ICD-10-CM

## 2013-11-20 DIAGNOSIS — M549 Dorsalgia, unspecified: Secondary | ICD-10-CM

## 2013-11-20 LAB — POCT URINALYSIS DIPSTICK
SPEC GRAV UA: 1.02
pH, UA: 5

## 2013-11-20 MED ORDER — METHOCARBAMOL 750 MG PO TABS: 750.0000 mg | ORAL_TABLET | Freq: Three times a day (TID) | ORAL | Status: DC

## 2013-11-20 NOTE — Patient Instructions (Addendum)
Ibuprofen 200 mg  2 every 4-6 hours as needed; if no stomach problems, may increase to 4 tabs 3 x per day (800 mg) TENS unit (Icy Hot Smart Relief) Ice or heat applications Back exercises  Back Exercises Back exercises help treat and prevent back injuries. The goal of back exercises is to increase the strength of your abdominal and back muscles and the flexibility of your back. These exercises should be started when you no longer have back pain. Back exercises include:  Pelvic Tilt. Lie on your back with your knees bent. Tilt your pelvis until the lower part of your back is against the floor. Hold this position 5 to 10 sec and repeat 5 to 10 times.  Knee to Chest. Pull first 1 knee up against your chest and hold for 20 to 30 seconds, repeat this with the other knee, and then both knees. This may be done with the other leg straight or bent, whichever feels better.  Sit-Ups or Curl-Ups. Bend your knees 90 degrees. Start with tilting your pelvis, and do a partial, slow sit-up, lifting your trunk only 30 to 45 degrees off the floor. Take at least 2 to 3 seconds for each sit-up. Do not do sit-ups with your knees out straight. If partial sit-ups are difficult, simply do the above but with only tightening your abdominal muscles and holding it as directed.  Hip-Lift. Lie on your back with your knees flexed 90 degrees. Push down with your feet and shoulders as you raise your hips a couple inches off the floor; hold for 10 seconds, repeat 5 to 10 times.  Back arches. Lie on your stomach, propping yourself up on bent elbows. Slowly press on your hands, causing an arch in your low back. Repeat 3 to 5 times. Any initial stiffness and discomfort should lessen with repetition over time.  Shoulder-Lifts. Lie face down with arms beside your body. Keep hips and torso pressed to floor as you slowly lift your head and shoulders off the floor. Do not overdo your exercises, especially in the beginning. Exercises may  cause you some mild back discomfort which lasts for a few minutes; however, if the pain is more severe, or lasts for more than 15 minutes, do not continue exercises until you see your caregiver. Improvement with exercise therapy for back problems is slow.  See your caregivers for assistance with developing a proper back exercise program. Document Released: 11/18/2004 Document Revised: 01/03/2012 Document Reviewed: 08/12/2011 [facility] Patient Information 2014 [place]Care, [place].

## 2013-11-26 ENCOUNTER — Encounter: Payer: Self-pay | Admitting: Nurse Practitioner

## 2013-11-26 NOTE — Progress Notes (Signed)
Subjective:  Presents for c/o right low back pain x 1 week. Slight radiation into the right side. No urinary symptoms. No fever. No new sexual partners. No specific history of injury, but was cutting some wood before this.   Objective:   BP 126/80  Ht 6\' 1"  (1.854 m)  Wt 167 lb 4 oz (75.864 kg)  BMI 22.07 kg/m2 NAD. Alert, oriented. Lungs clear. Heart RRR. No CVA tenderness to percussion. Tenderness and tight muscles noted in lower thoracic upper lumbar area near sacroiliac area. SLR neg bilat. Reflexes nl lower extremities. Active normal ROM of the back with pain noted with right lateral movement. Good rotation of the spine without tenderness. UA normal.  Assessment: Acute sprain or strain of sacroiliac region  Back pain - Plan: POCT urinalysis dipstick  Plan:  Meds ordered this encounter  Medications  . hydrocortisone-pramoxine (PROCTOFOAM-HC) rectal foam    Sig: Place 1 applicator rectally 2 (two) times daily as needed.  . methocarbamol (ROBAXIN) 750 MG tablet    Sig: Take 1 tablet (750 mg total) by mouth 3 (three) times daily. Prn muscle spasms    Dispense:  30 tablet    Refill:  0    Order Specific Question:  Supervising Provider    Answer:  Merlyn AlbertLUKING, WILLIAM S [[id number]]   Avoid excessive use of NSAIDs due to hx of reflux. Back exercises. Ice/heat applications. OTC TENS unit. Call back if persists.

## 2013-12-13 ENCOUNTER — Encounter: Payer: Self-pay | Admitting: Family Medicine

## 2013-12-13 ENCOUNTER — Ambulatory Visit (INDEPENDENT_AMBULATORY_CARE_PROVIDER_SITE_OTHER): Payer: No Typology Code available for payment source | Admitting: Family Medicine

## 2013-12-13 VITALS — BP 118/78 | Ht 73.0 in | Wt 172.0 lb

## 2013-12-13 DIAGNOSIS — M549 Dorsalgia, unspecified: Secondary | ICD-10-CM

## 2013-12-13 DIAGNOSIS — G8929 Other chronic pain: Secondary | ICD-10-CM

## 2013-12-13 MED ORDER — NABUMETONE 750 MG PO TABS: 750.0000 mg | ORAL_TABLET | Freq: Two times a day (BID) | ORAL | Status: DC
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
## 2013-12-15 DIAGNOSIS — M549 Dorsalgia, unspecified: Secondary | ICD-10-CM

## 2013-12-15 DIAGNOSIS — G8929 Other chronic pain: Secondary | ICD-10-CM | POA: Insufficient documentation

## 2016-10-06 ENCOUNTER — Emergency Department (HOSPITAL_COMMUNITY): Payer: No Typology Code available for payment source

## 2016-10-06 ENCOUNTER — Encounter (HOSPITAL_COMMUNITY): Payer: Self-pay | Admitting: Emergency Medicine

## 2016-10-06 ENCOUNTER — Emergency Department (HOSPITAL_COMMUNITY)
Admission: EM | Admit: 2016-10-06 | Discharge: 2016-10-06 | Disposition: A | Discharge status: Home / Self Care | Payer: No Typology Code available for payment source | Attending: Emergency Medicine | Admitting: Emergency Medicine

## 2016-10-06 DIAGNOSIS — Z79899 Other long term (current) drug therapy: Secondary | ICD-10-CM | POA: Insufficient documentation

## 2016-10-06 DIAGNOSIS — Y9301 Activity, walking, marching and hiking: Secondary | ICD-10-CM | POA: Insufficient documentation

## 2016-10-06 DIAGNOSIS — S31119A Laceration without foreign body of abdominal wall, unspecified quadrant without penetration into peritoneal cavity, initial encounter: Secondary | ICD-10-CM

## 2016-10-06 DIAGNOSIS — F17210 Nicotine dependence, cigarettes, uncomplicated: Secondary | ICD-10-CM | POA: Insufficient documentation

## 2016-10-06 DIAGNOSIS — Z23 Encounter for immunization: Secondary | ICD-10-CM | POA: Insufficient documentation

## 2016-10-06 DIAGNOSIS — S61412A Laceration without foreign body of left hand, initial encounter: Secondary | ICD-10-CM | POA: Insufficient documentation

## 2016-10-06 DIAGNOSIS — Y999 Unspecified external cause status: Secondary | ICD-10-CM | POA: Insufficient documentation

## 2016-10-06 DIAGNOSIS — S31609A Unspecified open wound of abdominal wall, unspecified quadrant with penetration into peritoneal cavity, initial encounter: Secondary | ICD-10-CM | POA: Insufficient documentation

## 2016-10-06 DIAGNOSIS — Y92410 Unspecified street and highway as the place of occurrence of the external cause: Secondary | ICD-10-CM | POA: Insufficient documentation

## 2016-10-06 LAB — COMPREHENSIVE METABOLIC PANEL
ALK PHOS: 54 U/L (ref 38–126)
ALT: 10 U/L — AB (ref 17–63)
AST: 19 U/L (ref 15–41)
Albumin: 4.5 g/dL (ref 3.5–5.0)
Anion gap: 9 (ref 5–15)
BUN: 16 mg/dL (ref 6–20)
CALCIUM: 9.3 mg/dL (ref 8.9–10.3)
CO2: 28 mmol/L (ref 22–32)
CREATININE: 0.98 mg/dL (ref 0.61–1.24)
Chloride: 99 mmol/L — ABNORMAL LOW (ref 101–111)
Glucose, Bld: 119 mg/dL — ABNORMAL HIGH (ref 65–99)
Potassium: 3.3 mmol/L — ABNORMAL LOW (ref 3.5–5.1)
Sodium: 136 mmol/L (ref 135–145)
Total Bilirubin: 0.5 mg/dL (ref 0.3–1.2)
Total Protein: 7.8 g/dL (ref 6.5–8.1)

## 2016-10-06 LAB — CBC WITH DIFFERENTIAL/PLATELET
BASOS ABS: 0.1 10*3/uL (ref 0.0–0.1)
Basophils Relative: 1 %
Eosinophils Absolute: 0.5 10*3/uL (ref 0.0–0.7)
Eosinophils Relative: 8 %
HCT: 44.3 % (ref 39.0–52.0)
HEMOGLOBIN: 16 g/dL (ref 13.0–17.0)
LYMPHS ABS: 1.9 10*3/uL (ref 0.7–4.0)
LYMPHS PCT: 29 %
MCH: 31.6 pg (ref 26.0–34.0)
MCHC: 36.1 g/dL — ABNORMAL HIGH (ref 30.0–36.0)
MCV: 87.5 fL (ref 78.0–100.0)
Monocytes Absolute: 0.6 10*3/uL (ref 0.1–1.0)
Monocytes Relative: 9 %
NEUTROS ABS: 3.4 10*3/uL (ref 1.7–7.7)
NEUTROS PCT: 53 %
Platelets: 270 10*3/uL (ref 150–400)
RBC: 5.06 MIL/uL (ref 4.22–5.81)
RDW: 12.5 % (ref 11.5–15.5)
WBC: 6.5 10*3/uL (ref 4.0–10.5)

## 2016-10-06 MED ORDER — OXYCODONE-ACETAMINOPHEN 5-325 MG PO TABS: 1.0000 | ORAL_TABLET | ORAL | 0 refills | Status: DC | PRN

## 2016-10-06 MED ORDER — IOPAMIDOL (ISOVUE-300) INJECTION 61%
100.0000 mL | Freq: Once | INTRAVENOUS | Status: AC | PRN
  Administered 2016-10-06: 100 mL via INTRAVENOUS

## 2016-10-06 MED ORDER — SODIUM CHLORIDE 0.9 % IV BOLUS (SEPSIS): 1000.0000 mL | Freq: Once | INTRAVENOUS | Status: DC

## 2016-10-06 MED ORDER — DIPHTH-ACELL PERTUSSIS-TETANUS 25-58-10 LF-MCG/0.5 IM SUSP
0.5000 mL | Freq: Once | INTRAMUSCULAR | Status: AC
  Administered 2016-10-06: 0.5 mL via INTRAMUSCULAR
  Filled 2016-10-06: qty 0.5

## 2016-10-06 MED ORDER — VANCOMYCIN HCL IN DEXTROSE 1-5 GM/200ML-% IV SOLN
1000.0000 mg | Freq: Once | INTRAVENOUS | Status: AC
  Administered 2016-10-06: 1000 mg via INTRAVENOUS
  Filled 2016-10-06: qty 200

## 2016-10-06 MED ORDER — SODIUM CHLORIDE 0.9 % IV BOLUS (SEPSIS)
1000.0000 mL | Freq: Once | INTRAVENOUS | Status: AC
  Administered 2016-10-06: 1000 mL via INTRAVENOUS

## 2016-10-06 MED ORDER — MORPHINE SULFATE (PF) 4 MG/ML IV SOLN
4.0000 mg | Freq: Once | INTRAVENOUS | Status: AC
  Administered 2016-10-06: 4 mg via INTRAVENOUS
  Filled 2016-10-06: qty 1

## 2016-10-06 MED ORDER — ONDANSETRON HCL 4 MG/2ML IJ SOLN
4.0000 mg | Freq: Once | INTRAMUSCULAR | Status: AC
  Administered 2016-10-06: 4 mg via INTRAVENOUS
  Filled 2016-10-06: qty 2
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
## 2016-10-06 NOTE — Discharge Instructions (Signed)
Tests show no life-threatening condition. Keep wounds clean and dry. Recommend recheck in 24 hours for any concerns. Prescription for pain medicine. Humulin given a tetanus shot today.

## 2016-10-06 NOTE — ED Notes (Signed)
Pt's left ring finger was cleansed, steri strips placed and gauze dressing applied overtop per Dr. Patsey Bertholdook's verbal order.

## 2016-10-06 NOTE — ED Notes (Signed)
Pt ambulated with no difficulty, MD notified.
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
## 2017-01-24 ENCOUNTER — Emergency Department (HOSPITAL_COMMUNITY)
Admission: EM | Admit: 2017-01-24 | Discharge: 2017-01-24 | Disposition: A | Discharge status: Home / Self Care | Payer: Self-pay | Attending: Emergency Medicine | Admitting: Emergency Medicine

## 2017-01-24 ENCOUNTER — Emergency Department (HOSPITAL_COMMUNITY): Payer: Self-pay

## 2017-01-24 ENCOUNTER — Encounter (HOSPITAL_COMMUNITY): Payer: Self-pay | Admitting: Emergency Medicine

## 2017-01-24 DIAGNOSIS — R1013 Epigastric pain: Secondary | ICD-10-CM

## 2017-01-24 DIAGNOSIS — Z79899 Other long term (current) drug therapy: Secondary | ICD-10-CM | POA: Insufficient documentation

## 2017-01-24 DIAGNOSIS — F17210 Nicotine dependence, cigarettes, uncomplicated: Secondary | ICD-10-CM | POA: Insufficient documentation

## 2017-01-24 LAB — CBC WITH DIFFERENTIAL/PLATELET
BASOS PCT: 1 %
Basophils Absolute: 0.1 10*3/uL (ref 0.0–0.1)
EOS ABS: 0.5 10*3/uL (ref 0.0–0.7)
Eosinophils Relative: 7 %
HEMATOCRIT: 43.1 % (ref 39.0–52.0)
Hemoglobin: 15.3 g/dL (ref 13.0–17.0)
LYMPHS ABS: 1.6 10*3/uL (ref 0.7–4.0)
LYMPHS PCT: 26 %
MCH: 31 pg (ref 26.0–34.0)
MCHC: 35.5 g/dL (ref 30.0–36.0)
MCV: 87.2 fL (ref 78.0–100.0)
MONO ABS: 0.5 10*3/uL (ref 0.1–1.0)
MONOS PCT: 8 %
NEUTROS ABS: 3.8 10*3/uL (ref 1.7–7.7)
NEUTROS PCT: 58 %
Platelets: 249 10*3/uL (ref 150–400)
RBC: 4.94 MIL/uL (ref 4.22–5.81)
RDW: 12.5 % (ref 11.5–15.5)
WBC: 6.4 10*3/uL (ref 4.0–10.5)

## 2017-01-24 LAB — BASIC METABOLIC PANEL
Anion gap: 13 (ref 5–15)
BUN: 21 mg/dL — ABNORMAL HIGH (ref 6–20)
CALCIUM: 9.8 mg/dL (ref 8.9–10.3)
CO2: 28 mmol/L (ref 22–32)
CREATININE: 0.9 mg/dL (ref 0.61–1.24)
Chloride: 96 mmol/L — ABNORMAL LOW (ref 101–111)
GFR calc non Af Amer: >60 mL/min (ref >60)
Glucose, Bld: 114 mg/dL — ABNORMAL HIGH (ref 65–99)
Potassium: 3.9 mmol/L (ref 3.5–5.1)
SODIUM: 137 mmol/L (ref 135–145)

## 2017-01-24 LAB — HEPATIC FUNCTION PANEL
ALBUMIN: 4.3 g/dL (ref 3.5–5.0)
ALT: 14 U/L — ABNORMAL LOW (ref 17–63)
AST: 17 U/L (ref 15–41)
Alkaline Phosphatase: 57 U/L (ref 38–126)
BILIRUBIN INDIRECT: 0.4 mg/dL (ref 0.3–0.9)
BILIRUBIN TOTAL: 0.5 mg/dL (ref 0.3–1.2)
Bilirubin, Direct: 0.1 mg/dL (ref 0.1–0.5)
Total Protein: 7.5 g/dL (ref 6.5–8.1)

## 2017-01-24 LAB — LIPASE, BLOOD: LIPASE: 19 U/L (ref 11–51)

## 2017-01-24 MED ORDER — PANTOPRAZOLE SODIUM 20 MG PO TBEC: 20.0000 mg | DELAYED_RELEASE_TABLET | Freq: Two times a day (BID) | ORAL | 0 refills | Status: AC

## 2017-01-24 MED ORDER — SODIUM CHLORIDE 0.9 % IV BOLUS (SEPSIS)
1000.0000 mL | Freq: Once | INTRAVENOUS | Status: AC
  Administered 2017-01-24: 1000 mL via INTRAVENOUS

## 2017-01-24 NOTE — ED Notes (Signed)
EKG given to Dr. Cook  

## 2017-01-24 NOTE — ED Provider Notes (Signed)
[facility] Provider Note   CSN: [id number] Arrival date & time: 01/24/17  1011     History   Chief Complaint Chief Complaint  Patient presents with  . Abdominal Pain    HPI [name] is a 26 y.o. male.  HPI  The 26 year old man comes in today complaining of epigastric discomfort over the past month. He states has worsened over the past week and occurs with certain foods. He has had some nausea but is only vomited once which was yesterday after eating lots of food on Easter. He is able to drink fluids and eat many foods without difficulty. He describes the pain is in the epigastric region. He describes having some mid back pain over the past month. He denies significant alcohol intake. States he thinks this is his gallbladder from research that he has done.  Past Medical History:  Diagnosis Date  . Anxiety   . Depression   . Drug addiction (HCC) 2012  . Seizures [facility])     Patient Active Problem List   Diagnosis Date Noted  . Chronic back pain 12/15/2013  . Loss of weight 03/14/2013  . GERD (gastroesophageal reflux disease) 01/15/2013  . Adjustment disorder with mixed anxiety and depressed mood 01/15/2013  . Depression 01/15/2013  . Generalized anxiety disorder 01/15/2013    Past Surgical History:  Procedure Laterality Date  . ESOPHAGOGASTRODUODENOSCOPY N/A 01/24/2013   Procedure: ESOPHAGOGASTRODUODENOSCOPY (EGD);  Surgeon: Malissa Hippo, MD;  Location[facility];  Service: Endoscopy;  Laterality: N/A;  210       Home Medications    Prior to Admission medications   Medication Sig Start Date End Date Taking? Authorizing Provider  acetaminophen (TYLENOL) 325 MG tablet Take 650 mg by mouth every 6 (six) hours as needed for pain.   Yes Historical Provider, MD    Family History History reviewed. No pertinent family history.  Social History Social History  Substance Use Topics  . Smoking status: Current Every Day Smoker    Packs/day: 0.50   Years: 4.50    Types: Cigarettes  . Smokeless tobacco: Former Neurosurgeon     Comment: 1/2 pack a day x 3 1/2 yrs.  . Alcohol use Yes     Comment: occasionally     Allergies   Zithromax [azithromycin] and Penicillins   Review of Systems Review of Systems  All other systems reviewed and are negative.    Physical Exam Updated Vital Signs BP 135/80 (BP Location: Right Arm)   Pulse 79   Temp 98.7 F (37.1 C) (Oral)   Resp 17   Ht  (1.854 m)   Wt 85.4 kg   SpO2 100%   BMI 24.84 kg/m   Physical Exam  Constitutional: He is oriented to person, place, and time. He appears well-developed and well-nourished.  HENT:  Head: Normocephalic and atraumatic.  Right Ear: External ear normal.  Left Ear: External ear normal.  Nose: Nose normal.  Mouth/Throat: Oropharynx is clear and moist.  Eyes: Conjunctivae and EOM are normal. Pupils are equal, round, and reactive to light.  Neck: Normal range of motion. Neck supple.  Cardiovascular: Normal rate, regular rhythm, normal heart sounds and intact distal pulses.   Pulmonary/Chest: Effort normal and breath sounds normal.  Abdominal: Soft. Bowel sounds are normal.  Musculoskeletal: Normal range of motion.  Neurological: He is alert and oriented to person, place, and time. He has normal reflexes.  Skin: Skin is warm and dry.  Psychiatric: He has a normal  mood and affect. His behavior is normal. Judgment and thought content normal.  Nursing note and vitals reviewed.    ED Treatments / Results  Labs (all labs ordered are listed, but only abnormal results are displayed) Labs Reviewed  BASIC METABOLIC PANEL - Abnormal; Notable for the following:       Result Value   Chloride 96 (*)    Glucose, Bld 114 (*)    BUN 21 (*)    All other components within normal limits  HEPATIC FUNCTION PANEL - Abnormal; Notable for the following:    ALT 14 (*)    All other components within normal limits  CBC WITH DIFFERENTIAL/PLATELET  LIPASE, BLOOD     EKG  EKG Interpretation  Date/Time:  Monday January 24 2017 10:33:37 EDT Ventricular Rate:  86 PR Interval:  114 QRS Duration: 102 QT Interval:  364 QTC Calculation: 435 R Axis:   86 Text Interpretation:  Normal sinus rhythm with sinus arrhythmia Incomplete right bundle branch block Borderline ECG Confirmed by Akiya Morr MD, Duwayne Heck [phone]) on 01/24/2017 5:35:39 PM       Radiology US Abdomen Complete  Result Date: 01/24/2017 CLINICAL DATA:  Epigastric pain for 2 months, worse over the last 2 weeks EXAM: ABDOMEN ULTRASOUND COMPLETE COMPARISON:  Ultrasound 01/05/2013.  CT 10/06/2016. FINDINGS: Gallbladder: No gallstones or wall thickening visualized. No sonographic Murphy sign noted by sonographer. Common bile duct: Diameter: 4 mm. Liver: No focal lesion identified. Within normal limits in parenchymal echogenicity. IVC: No abnormality visualized. Pancreas: Visualized portion unremarkable. Spleen: Size and appearance within normal limits. Right Kidney: Length: 10.6 cm. Echogenicity within normal limits. No mass or hydronephrosis visualized. Left Kidney: Length: 11.0 cm. Echogenicity within normal limits. No mass or hydronephrosis visualized. Abdominal aorta: No aneurysm visualized. Other findings: None. IMPRESSION: No acute findings or significant change from previous study. No explanation for the patient's symptoms. Electronically Signed   By: Carey Bullocks M.D.   On: 01/24/2017 17:02    Procedures Procedures (including critical care time)  Medications Ordered in ED Medications  sodium chloride 0.9 % bolus 1,000 mL (1,000 mLs Intravenous New Bag/Given 01/24/17 1538)     Initial Impression / Assessment and Plan / ED Course  I have reviewed the triage vital signs and the nursing notes.  Pertinent labs & imaging results that were available during my care of the patient were reviewed by me and considered in my medical decision making (see chart for details).     26 year old male with  intermittent epigastric discomfort who is hemodynamically stable here with normal labs and normal ultrasound. Plan to start Protonix. Patient advised of return precautions and advised to follow-up with his primary care physician, Dr. Lubertha South.  Final Clinical Impressions(s) / ED Diagnoses   Final diagnoses:  Intermittent epigastric abdominal pain    New Prescriptions New Prescriptions   PANTOPRAZOLE (PROTONIX) 20 MG TABLET    Take 1 tablet (20 mg total) by mouth 2 (two) times daily.     Margarita Grizzle, MD 01/24/17 [phone]

## 2017-01-24 NOTE — ED Triage Notes (Signed)
Pt c/o intermittent pain to epigastric area over 2 months. Worsening over the last 2 weeks. Denies anyting that makes It better but states milk makes the pain worse. Pt states gets sob with some sweating that comes with the pain. Pt does not have any of those sx's now. nad

## 2017-06-20 ENCOUNTER — Encounter (HOSPITAL_COMMUNITY): Payer: Self-pay | Admitting: Emergency Medicine

## 2017-06-20 ENCOUNTER — Emergency Department (HOSPITAL_COMMUNITY)
Admission: EM | Admit: 2017-06-20 | Discharge: 2017-06-20 | Disposition: A | Discharge status: Home / Self Care | Payer: Self-pay | Attending: Emergency Medicine | Admitting: Emergency Medicine

## 2017-06-20 DIAGNOSIS — Z79899 Other long term (current) drug therapy: Secondary | ICD-10-CM | POA: Insufficient documentation

## 2017-06-20 DIAGNOSIS — L03113 Cellulitis of right upper limb: Secondary | ICD-10-CM | POA: Insufficient documentation

## 2017-06-20 DIAGNOSIS — F17210 Nicotine dependence, cigarettes, uncomplicated: Secondary | ICD-10-CM | POA: Insufficient documentation

## 2017-06-20 MED ORDER — SULFAMETHOXAZOLE-TRIMETHOPRIM 800-160 MG PO TABS: 1.0000 | ORAL_TABLET | Freq: Two times a day (BID) | ORAL | 0 refills | Status: AC

## 2017-06-20 MED ORDER — SULFAMETHOXAZOLE-TRIMETHOPRIM 800-160 MG PO TABS
1.0000 | ORAL_TABLET | Freq: Once | ORAL | Status: AC
  Administered 2017-06-20: 1 via ORAL
  Filled 2017-06-20: qty 1
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
## 2017-06-20 NOTE — ED Triage Notes (Signed)
Pt has mild swelling noted to posterior right hand x 4 days. Mild redness noted and warm to touch. Unsure if was bitten by something. Denies fever.

## 2017-06-20 NOTE — Discharge Instructions (Signed)
Warm compresses on/off to your hand.  Take the antibiotics as directed.  Ibuprofen 800 mg 3 times a day.  Follow-up with Dr. Gerda Diss or return here in 2-3 days for recheck

## 2017-06-23 NOTE — ED Provider Notes (Signed)
[facility] Provider Note   CSN: [id number] Arrival date & time: 06/20/17  1055     History   Chief Complaint Chief Complaint  Patient presents with  . Insect Bite    HPI [name] is a 26 y.o. male.  HPI   [name] is a 26 y.o. male who presents to the Emergency Department complaining of pain, redness and swelling of the dorsal right hand.  Pain associated with gripping objects and palpation.  He believes he may have been bitten by an insect.  He has tried warm compresses.  He denies open wound, numbness, pain to fingers or proximal to the wrist.   Past Medical History:  Diagnosis Date  . Anxiety   . Depression   . Drug addiction (HCC) 2012  . Seizures [facility])     Patient Active Problem List   Diagnosis Date Noted  . Chronic back pain 12/15/2013  . Loss of weight 03/14/2013  . GERD (gastroesophageal reflux disease) 01/15/2013  . Adjustment disorder with mixed anxiety and depressed mood 01/15/2013  . Depression 01/15/2013  . Generalized anxiety disorder 01/15/2013    Past Surgical History:  Procedure Laterality Date  . ESOPHAGOGASTRODUODENOSCOPY N/A 01/24/2013   Procedure: ESOPHAGOGASTRODUODENOSCOPY (EGD);  Surgeon: Malissa Hippo, MD;  [facility];  Service: Endoscopy;  Laterality: N/A;  210       Home Medications    Prior to Admission medications   Medication Sig Start Date End Date Taking? Authorizing Provider  acetaminophen (TYLENOL) 325 MG tablet Take 650 mg by mouth every 6 (six) hours as needed for pain.    [provider]  pantoprazole (PROTONIX) 20 MG tablet Take 1 tablet (20 mg total) by mouth 2 (two) times daily. 01/24/17   Margarita Grizzle, MD  sulfamethoxazole-trimethoprim (BACTRIM DS,SEPTRA DS) 800-160 MG tablet Take 1 tablet by mouth 2 (two) times daily. For 10 days 06/20/17 06/30/17  Pauline Aus, PA-C    Family History History reviewed. No pertinent family history.  Social History Social History    Substance Use Topics  . Smoking status: Current Every Day Smoker    Packs/day: 0.50    Years: 4.50    Types: Cigarettes  . Smokeless tobacco: Former Neurosurgeon     Comment: 1/2 pack a day x 3 1/2 yrs.  . Alcohol use Yes     Comment: occasionally     Allergies   Zithromax [azithromycin] and Penicillins   Review of Systems Review of Systems  Constitutional: Negative for activity change, appetite change, chills and fever.  HENT: Negative for facial swelling, sore throat and trouble swallowing.   Respiratory: Negative for chest tightness, shortness of breath and wheezing.   Musculoskeletal: Negative for neck pain and neck stiffness.  Skin: Positive for rash. Negative for wound.  Neurological: Negative for dizziness, weakness, numbness and headaches.  All other systems reviewed and are negative.    Physical Exam Updated Vital Signs BP (!) 127/92 (BP Location: Left Arm)   Pulse 90   Temp 98.3 F (36.8 C)   Resp 16   SpO2 98%   Physical Exam  Constitutional: He is oriented to person, place, and time. He appears well-developed and well-nourished. No distress.  HENT:  Head: Normocephalic and atraumatic.  Mouth/Throat: Oropharynx is clear and moist.  Neck: Normal range of motion. Neck supple.  Cardiovascular: Normal rate, regular rhythm and intact distal pulses.   No murmur heard. Pulmonary/Chest: Effort normal and breath sounds normal. No respiratory distress.  Musculoskeletal:  He exhibits no edema or tenderness.  Pt has full ROM of the right hand and fingers  Lymphadenopathy:    He has no cervical adenopathy.  Neurological: He is alert and oriented to person, place, and time. No sensory deficit. He exhibits normal muscle tone. Coordination normal.  Skin: Skin is warm. Capillary refill takes less than 2 seconds. Rash noted. There is erythema.  Erythema and excessive warmth of the dorsal right hand.  No lymphangitis or induration.    Nursing note and vitals reviewed.    ED  Treatments / Results  Labs (all labs ordered are listed, but only abnormal results are displayed) Labs Reviewed - No data to display  EKG  EKG Interpretation None       Radiology No results found.  Procedures Procedures (including critical care time)  Medications Ordered in ED Medications  sulfamethoxazole-trimethoprim (BACTRIM DS,SEPTRA DS) 800-160 MG per tablet 1 tablet (1 tablet Oral Given 06/20/17 1315)     Initial Impression / Assessment and Plan / ED Course  I have reviewed the triage vital signs and the nursing notes.  Pertinent labs & imaging results that were available during my care of the patient were reviewed by me and considered in my medical decision making (see chart for details).    Focal symptoms of the dorsal right hand that appear c/w early cellulitis.  NV intact.  No abscess.  Pt agrees to warm compresses, elevation and close ER return if sx worsen.  Final Clinical Impressions(s) / ED Diagnoses   Final diagnoses:  Cellulitis of right upper extremity    New Prescriptions Discharge Medication List as of 06/20/2017  1:12 PM    START taking these medications   Details  sulfamethoxazole-trimethoprim (BACTRIM DS,SEPTRA DS) 800-160 MG tablet Take 1 tablet by mouth 2 (two) times daily. For 10 days, Starting Mon 06/20/2017, Until Thu 06/30/2017, Print         Retreatriplett, [place], PA-C 06/23/17 1744    Vanetta MuldersZackowski, Scott, MD 06/29/17 [phone]

## 2018-04-02 ENCOUNTER — Emergency Department (HOSPITAL_COMMUNITY): Payer: Self-pay

## 2018-04-02 ENCOUNTER — Inpatient Hospital Stay (HOSPITAL_COMMUNITY)
Admission: EM | Admit: 2018-04-02 | Discharge: 2018-04-02 | DRG: 917 | Discharge status: Against Medical Advice | Payer: Self-pay | Attending: Emergency Medicine | Admitting: Emergency Medicine

## 2018-04-02 ENCOUNTER — Encounter (HOSPITAL_COMMUNITY): Payer: Self-pay | Admitting: *Deleted

## 2018-04-02 ENCOUNTER — Other Ambulatory Visit: Payer: Self-pay

## 2018-04-02 DIAGNOSIS — Z9911 Dependence on respirator [ventilator] status: Secondary | ICD-10-CM

## 2018-04-02 DIAGNOSIS — R230 Cyanosis: Secondary | ICD-10-CM | POA: Diagnosis present

## 2018-04-02 DIAGNOSIS — T424X1A Poisoning by benzodiazepines, accidental (unintentional), initial encounter: Secondary | ICD-10-CM | POA: Diagnosis present

## 2018-04-02 DIAGNOSIS — T405X1A Poisoning by cocaine, accidental (unintentional), initial encounter: Secondary | ICD-10-CM | POA: Diagnosis present

## 2018-04-02 DIAGNOSIS — J9811 Atelectasis: Secondary | ICD-10-CM | POA: Diagnosis present

## 2018-04-02 DIAGNOSIS — Y92810 Car as the place of occurrence of the external cause: Secondary | ICD-10-CM

## 2018-04-02 DIAGNOSIS — Z880 Allergy status to penicillin: Secondary | ICD-10-CM

## 2018-04-02 DIAGNOSIS — Z881 Allergy status to other antibiotic agents status: Secondary | ICD-10-CM

## 2018-04-02 DIAGNOSIS — F17210 Nicotine dependence, cigarettes, uncomplicated: Secondary | ICD-10-CM | POA: Diagnosis present

## 2018-04-02 DIAGNOSIS — F1910 Other psychoactive substance abuse, uncomplicated: Secondary | ICD-10-CM

## 2018-04-02 DIAGNOSIS — F1920 Other psychoactive substance dependence, uncomplicated: Secondary | ICD-10-CM | POA: Diagnosis present

## 2018-04-02 DIAGNOSIS — F419 Anxiety disorder, unspecified: Secondary | ICD-10-CM | POA: Diagnosis present

## 2018-04-02 DIAGNOSIS — F329 Major depressive disorder, single episode, unspecified: Secondary | ICD-10-CM | POA: Diagnosis present

## 2018-04-02 DIAGNOSIS — R0603 Acute respiratory distress: Secondary | ICD-10-CM | POA: Diagnosis present

## 2018-04-02 DIAGNOSIS — T40601A Poisoning by unspecified narcotics, accidental (unintentional), initial encounter: Principal | ICD-10-CM | POA: Diagnosis present

## 2018-04-02 DIAGNOSIS — J9601 Acute respiratory failure with hypoxia: Secondary | ICD-10-CM | POA: Diagnosis present

## 2018-04-02 LAB — COMPREHENSIVE METABOLIC PANEL
ALT: 17 U/L (ref 17–63)
ANION GAP: 12 (ref 5–15)
AST: 27 U/L (ref 15–41)
Albumin: 4.3 g/dL (ref 3.5–5.0)
Alkaline Phosphatase: 48 U/L (ref 38–126)
BUN: 10 mg/dL (ref 6–20)
CHLORIDE: 104 mmol/L (ref 101–111)
CO2: 22 mmol/L (ref 22–32)
CREATININE: 0.89 mg/dL (ref 0.61–1.24)
Calcium: 8.7 mg/dL — ABNORMAL LOW (ref 8.9–10.3)
Glucose, Bld: 177 mg/dL — ABNORMAL HIGH (ref 65–99)
Potassium: 3.7 mmol/L (ref 3.5–5.1)
SODIUM: 138 mmol/L (ref 135–145)
Total Bilirubin: 0.5 mg/dL (ref 0.3–1.2)
Total Protein: 7.5 g/dL (ref 6.5–8.1)

## 2018-04-02 LAB — I-STAT CHEM 8, ED
BUN: 9 mg/dL (ref 6–20)
CALCIUM ION: 1.12 mmol/L — AB (ref 1.15–1.40)
CREATININE: 0.9 mg/dL (ref 0.61–1.24)
Chloride: 100 mmol/L — ABNORMAL LOW (ref 101–111)
GLUCOSE: 120 mg/dL — AB (ref 65–99)
HCT: 41 % (ref 39.0–52.0)
Hemoglobin: 13.9 g/dL (ref 13.0–17.0)
Potassium: 3.1 mmol/L — ABNORMAL LOW (ref 3.5–5.1)
Sodium: 141 mmol/L (ref 135–145)
TCO2: 26 mmol/L (ref 22–32)

## 2018-04-02 LAB — RAPID HIV SCREEN (HIV 1/2 AB+AG)
HIV 1/2 ANTIBODIES: NONREACTIVE
HIV-1 P24 ANTIGEN - HIV24: NONREACTIVE

## 2018-04-02 LAB — TRIGLYCERIDES: Triglycerides: 331 mg/dL — ABNORMAL HIGH (ref <150)

## 2018-04-02 LAB — RAPID URINE DRUG SCREEN, HOSP PERFORMED
Amphetamines: NOT DETECTED
Barbiturates: NOT DETECTED
Benzodiazepines: POSITIVE — AB
Cocaine: POSITIVE — AB
Opiates: POSITIVE — AB
Tetrahydrocannabinol: NOT DETECTED

## 2018-04-02 LAB — CBC WITH DIFFERENTIAL/PLATELET
Basophils Absolute: 0 10*3/uL (ref 0.0–0.1)
Basophils Relative: 0 %
EOS ABS: 0.4 10*3/uL (ref 0.0–0.7)
EOS PCT: 4 %
HCT: 42.9 % (ref 39.0–52.0)
Hemoglobin: 14.7 g/dL (ref 13.0–17.0)
LYMPHS ABS: 1.6 10*3/uL (ref 0.7–4.0)
LYMPHS PCT: 16 %
MCH: 30.9 pg (ref 26.0–34.0)
MCHC: 34.3 g/dL (ref 30.0–36.0)
MCV: 90.3 fL (ref 78.0–100.0)
MONOS PCT: 5 %
Monocytes Absolute: 0.5 10*3/uL (ref 0.1–1.0)
Neutro Abs: 7.5 10*3/uL (ref 1.7–7.7)
Neutrophils Relative %: 75 %
Platelets: 184 10*3/uL (ref 150–400)
RBC: 4.75 MIL/uL (ref 4.22–5.81)
RDW: 12.3 % (ref 11.5–15.5)
WBC: 10 10*3/uL (ref 4.0–10.5)

## 2018-04-02 LAB — I-STAT CG4 LACTIC ACID, ED
Lactic Acid, Venous: 3.58 mmol/L (ref 0.5–1.9)
Lactic Acid, Venous: 3.47 mmol/L (ref 0.5–1.9)

## 2018-04-02 LAB — BLOOD GAS, ARTERIAL
Acid-base deficit: 3.9 mmol/L — ABNORMAL HIGH (ref 0.0–2.0)
BICARBONATE: 20.6 mmol/L (ref 20.0–28.0)
Drawn by: 22223
FIO2: 45
LHR: 16 {breaths}/min
O2 Saturation: 98.9 %
PEEP: 5 cmH2O
PO2 ART: 168 mmHg — AB (ref 83.0–108.0)
VT: 600 mL
pCO2 arterial: 46.9 mmHg (ref 32.0–48.0)
pH, Arterial: 7.287 — ABNORMAL LOW (ref 7.350–7.450)

## 2018-04-02 LAB — LACTIC ACID, PLASMA: LACTIC ACID, VENOUS: 1.8 mmol/L (ref 0.5–1.9)

## 2018-04-02 LAB — SALICYLATE LEVEL

## 2018-04-02 LAB — GLUCOSE, CAPILLARY: Glucose-Capillary: 73 mg/dL (ref 65–99)

## 2018-04-02 LAB — TROPONIN I

## 2018-04-02 LAB — I-STAT TROPONIN, ED: Troponin i, poc: 0 ng/mL (ref 0.00–0.08)

## 2018-04-02 LAB — ACETAMINOPHEN LEVEL

## 2018-04-02 LAB — PROCALCITONIN

## 2018-04-02 LAB — ETHANOL: ALCOHOL ETHYL (B): 127 mg/dL — AB (ref <10)

## 2018-04-02 MED ORDER — FENTANYL BOLUS VIA INFUSION
50.0000 ug | INTRAVENOUS | Status: DC | PRN
  Filled 2018-04-02: qty 50

## 2018-04-02 MED ORDER — VECURONIUM BROMIDE 10 MG IV SOLR
10.0000 mg | Freq: Once | INTRAVENOUS | Status: AC
  Administered 2018-04-02: 10 mg via INTRAVENOUS

## 2018-04-02 MED ORDER — HEPARIN SODIUM (PORCINE) 5000 UNIT/ML IJ SOLN: 5000.0000 [IU] | Freq: Three times a day (TID) | INTRAMUSCULAR | Status: DC

## 2018-04-02 MED ORDER — MIDAZOLAM HCL 2 MG/2ML IJ SOLN: 2.0000 mg | INTRAMUSCULAR | Status: DC | PRN

## 2018-04-02 MED ORDER — SUCCINYLCHOLINE CHLORIDE 20 MG/ML IJ SOLN
100.0000 mg | Freq: Once | INTRAMUSCULAR | Status: AC
  Administered 2018-04-02: 100 mg via INTRAVENOUS

## 2018-04-02 MED ORDER — NALOXONE HCL 2 MG/2ML IJ SOSY: 2.0000 mg | PREFILLED_SYRINGE | Freq: Once | INTRAMUSCULAR | Status: DC

## 2018-04-02 MED ORDER — ETOMIDATE 2 MG/ML IV SOLN
20.0000 mg | Freq: Once | INTRAVENOUS | Status: AC
  Administered 2018-04-02: 20 mg via INTRAVENOUS

## 2018-04-02 MED ORDER — PROPOFOL 1000 MG/100ML IV EMUL
INTRAVENOUS | Status: AC
  Filled 2018-04-02: qty 100

## 2018-04-02 MED ORDER — FENTANYL CITRATE (PF) 100 MCG/2ML IJ SOLN
100.0000 ug | INTRAMUSCULAR | Status: DC | PRN
  Administered 2018-04-02: 100 ug via INTRAVENOUS
  Filled 2018-04-02: qty 2

## 2018-04-02 MED ORDER — NALOXONE HCL 2 MG/2ML IJ SOSY
2.0000 mg | PREFILLED_SYRINGE | Freq: Once | INTRAMUSCULAR | Status: AC
  Administered 2018-04-02: 2 mg via INTRAVENOUS

## 2018-04-02 MED ORDER — SODIUM CHLORIDE 0.9 % IV BOLUS
1000.0000 mL | Freq: Once | INTRAVENOUS | Status: AC
  Administered 2018-04-02: 1000 mL via INTRAVENOUS

## 2018-04-02 MED ORDER — PROPOFOL 1000 MG/100ML IV EMUL
5.0000 ug/kg/min | Freq: Once | INTRAVENOUS | Status: DC
  Administered 2018-04-02: 10 ug/kg/min via INTRAVENOUS

## 2018-04-02 MED ORDER — FENTANYL 2500MCG IN NS 250ML (10MCG/ML) PREMIX INFUSION: 25.0000 ug/h | INTRAVENOUS | Status: DC

## 2018-04-02 MED ORDER — FENTANYL CITRATE (PF) 100 MCG/2ML IJ SOLN: 50.0000 ug | Freq: Once | INTRAMUSCULAR | Status: DC

## 2018-04-02 MED ORDER — SODIUM CHLORIDE 0.9 % IV SOLN
INTRAVENOUS | Status: DC
Start: 2018-04-02 — End: 2018-04-02
  Administered 2018-04-02: 02:00:00 via INTRAVENOUS

## 2018-04-02 MED ORDER — PROPOFOL 1000 MG/100ML IV EMUL
5.0000 ug/kg/min | INTRAVENOUS | Status: DC
  Administered 2018-04-02 (×3): 10 ug/kg/min via INTRAVENOUS
  Administered 2018-04-02: 20 ug/kg/min via INTRAVENOUS
  Administered 2018-04-02: 10 ug/kg/min via INTRAVENOUS

## 2018-04-02 MED ORDER — SODIUM CHLORIDE 0.9 % IV SOLN: INTRAVENOUS | Status: DC

## 2018-04-02 MED ORDER — ONDANSETRON HCL 4 MG/2ML IJ SOLN
8.0000 mg | Freq: Once | INTRAMUSCULAR | Status: AC
  Administered 2018-04-02: 8 mg via INTRAVENOUS

## 2018-04-02 MED ORDER — INSULIN ASPART 100 UNIT/ML ~~LOC~~ SOLN: 0.0000 [IU] | SUBCUTANEOUS | Status: DC

## 2018-04-02 MED ORDER — FENTANYL CITRATE (PF) 100 MCG/2ML IJ SOLN
100.0000 ug | INTRAMUSCULAR | Status: DC | PRN
Start: 2018-04-02 — End: 2018-04-02

## 2018-04-02 MED ORDER — SODIUM CHLORIDE 0.9 % IV BOLUS: 1000.0000 mL | Freq: Once | INTRAVENOUS | Status: DC

## 2018-04-02 MED ORDER — PANTOPRAZOLE SODIUM 40 MG PO PACK
40.0000 mg | PACK | Freq: Every day | ORAL | Status: DC
  Filled 2018-04-02: qty 20

## 2018-04-02 MED ORDER — SODIUM CHLORIDE 0.9 % IV SOLN: 250.0000 mL | INTRAVENOUS | Status: DC | PRN
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
## 2018-04-02 NOTE — Procedures (Signed)
Extubation Procedure Note  Patient Details:   Name: [name] DOB: 07/05/1991 MRN: [id number]   Airway Documentation:    Vent end date: 04/02/18 Vent end time: 0835   Positive cuff leak prior to extubation.   Evaluation  O2 sats: stable throughout Complications: No apparent complications Patient did tolerate procedure well. Bilateral Breath Sounds: Clear   Yes- pt has clear ability to speak.   Pt extubated to 3L [place] with RN and family member at bedside. No signs of distress or stridor noted. BBS clear.  Julieanne MansonAllison  Lamia Mariner 04/02/2018, 8:40 AM
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
## 2018-04-02 NOTE — ED Notes (Signed)
Date and time results received: 04/02/18 4:50 AM    Test: lactic acid Critical Value:3.47  Name of Provider Notified: DR Rancour  Orders Received? Or Actions Taken?: MD notified

## 2018-04-02 NOTE — H&P (Signed)
PULMONARY / CRITICAL CARE MEDICINE   Name: [name] MRN: [id number] DOB: 01/05/1991    ADMISSION DATE:  04/02/2018   [facility]  CHIEF COMPLAINT: Ventilator dependent respiratory failure secondary to polysubstance abuse  HISTORY OF PRESENT ILLNESS:   27 year old male who presented independent hospital by private motor vehicle unconscious cyanotic but with a palpable pulse.  He required his intubation independent hospital and was [facility] and [facility].  [facility] he was awake alert follows commands was quickly transitioned to pressure support ventilation and was on minimal requirements.  We will continue to evaluate him for possible extubation.  Very little past medical history is available.  PAST MEDICAL HISTORY :  He  has a past medical history of Anxiety, Depression, Drug addiction ([facility]) (2012), and Seizures ([facility]).  PAST SURGICAL HISTORY: He  has a past surgical history that includes Esophagogastroduodenoscopy (N/A, 01/24/2013).  Allergies  Allergen Reactions  . Zithromax [Azithromycin] Other (See Comments)    Hands swelling  . Penicillins Itching and Rash    Has patient had a PCN reaction causing immediate rash, facial/tongue/throat swelling, SOB or lightheadedness with hypotension: No unknown Has patient had a PCN reaction causing severe rash involving mucus membranes or skin necrosis: No unknown Has patient had a PCN reaction that required hospitalization No Has patient had a PCN reaction occurring within the last 10 years: No If all of the above answers are "NO", then may proceed with Cephalosporin use.    No current facility-administered medications on file prior to encounter.    Current Outpatient Medications on File Prior to Encounter  Medication Sig  . acetaminophen (TYLENOL) 325 MG tablet Take 650 mg by mouth every 6 (six) hours as needed for pain.  .  pantoprazole (PROTONIX) 20 MG tablet Take 1 tablet (20 mg total) by mouth 2 (two) times daily.    FAMILY HISTORY:  His indicated that his mother is alive. He indicated that his father is alive. He indicated that his brother is alive.   SOCIAL HISTORY: He  reports that he has been smoking cigarettes.  He has a 2.25 pack-year smoking history. He has quit using smokeless tobacco. He reports that he drinks alcohol. He reports that he has current or past drug history.  REVIEW OF SYSTEMS:   Unavailable  SUBJECTIVE:  Currently on full mechanical ventilatory support but awake and follows commands  VITAL SIGNS: BP 98/61   Pulse 62   Temp (!) 96.6 F (35.9 C)   Resp 15   Ht 6\' 1"  (1.854 m)   Wt 85.3 kg (188 lb)   SpO2 100%   BMI 24.80 kg/m   HEMODYNAMICS:    VENTILATOR SETTINGS: Vent Mode: PRVC FiO2 (%):  [35 %-45 %] 35 % Set Rate:  [14 bmp-18 bmp] 18 bmp Vt Set:  [600 mL-640 mL] 640 mL PEEP:  [5 cmH20] 5 cmH20 Plateau Pressure:  [9 cmH20-18 cmH20] 18 cmH20  INTAKE / OUTPUT: I/O last 3 completed shifts: In: 1615 [I.V.:615; IV Piggyback:1000] Out: -   PHYSICAL EXAMINATION: General: Developed male no acute distress on full mechanical with her support Neuro: Arouses to voice follows commands moves all extremities able to right note HEENT: Endotracheal tube to ventilator gastric tube to suction Cardiovascular: Heart sounds regular regular rate and rhythm Lungs: Decreased breath sounds in the bases mild rhonchi Abdomen: Soft nontender positive bowel sounds Musculoskeletal: Intact Skin: Warm and dry  LABS:  BMET Recent Labs  Lab 04/02/18 0231 04/02/18 0255  NA 138 141  K 3.7 3.1*  CL 104 100*  CO2 22  --   BUN 10 9  CREATININE 0.89 0.90  GLUCOSE 177* 120*    Electrolytes Recent Labs  Lab 04/02/18 0231  CALCIUM 8.7*    CBC Recent Labs  Lab 04/02/18 0231 04/02/18 0255  WBC 10.0  --   HGB 14.7 13.9  HCT 42.9 41.0  PLT 184  --     Coag's No results  for input(s): APTT, INR in the last 168 hours.  Sepsis Markers Recent Labs  Lab 04/02/18 0256 04/02/18 0449  LATICACIDVEN 3.58* 3.47*    ABG Recent Labs  Lab 04/02/18 0248  PHART 7.287*  PCO2ART 46.9  PO2ART 168.0*    Liver Enzymes Recent Labs  Lab 04/02/18 0231  AST 27  ALT 17  ALKPHOS 48  BILITOT 0.5  ALBUMIN 4.3    Cardiac Enzymes No results for input(s): TROPONINI, PROBNP in the last 168 hours.  Glucose Recent Labs  Lab 04/02/18 0659  GLUCAP 73    Imaging Ct Head Wo Contrast  Result Date: 04/02/2018 CLINICAL DATA:  Took unknown amount of moonshine and may have taken heroin. Altered level of consciousness and agonal respirations, acute onset. EXAM: CT HEAD WITHOUT CONTRAST TECHNIQUE: Contiguous axial images were obtained from the base of the skull through the vertex without intravenous contrast. COMPARISON:  CT of the head performed 01/26/2009, and MRI of the brain performed 01/27/2009 FINDINGS: Brain: No evidence of acute infarction, hemorrhage, hydrocephalus, extra-axial collection or mass lesion/mass effect. The posterior fossa, including the cerebellum, brainstem and fourth ventricle, is within normal limits. The third and lateral ventricles, and basal ganglia are unremarkable in appearance. The cerebral hemispheres are symmetric in appearance, with normal gray-white differentiation. No mass effect or midline shift is seen. Vascular: No hyperdense vessel or unexpected calcification. Skull: There is no evidence of fracture; visualized osseous structures are unremarkable in appearance. Sinuses/Orbits: The visualized portions of the orbits are within normal limits. There is opacification of the nasal passages, and partial opacification of the right maxillary sinus. The remaining paranasal sinuses and mastoid air cells are well-aerated. Other: No significant soft tissue abnormalities are seen. IMPRESSION: 1. No acute intracranial pathology seen on CT. 2. Opacification of  the nasal passages, and partial opacification of the right maxillary sinus. Electronically Signed   By: Roanna Raider M.D.   On: 04/02/2018 03:18   Dg Chest Portable 1 View  Result Date: 04/02/2018 CLINICAL DATA:  Endotracheal tube placement. EXAM: PORTABLE CHEST 1 VIEW COMPARISON:  Chest radiograph performed 10/06/2016 FINDINGS: The patient's endotracheal tube is seen ending 2-3 cm above the carina. The enteric tube is noted extending below the diaphragm. The lungs are hypoexpanded. Vascular congestion is noted. Mildly increased interstitial markings may reflect atelectasis or mild interstitial edema. No pleural effusion or pneumothorax is seen. The cardiomediastinal silhouette is normal in size. No acute osseous abnormalities are seen. IMPRESSION: 1. Endotracheal tube seen ending 2-3 cm above the carina. 2. Lungs hypoexpanded. Vascular congestion noted. Mildly increased interstitial markings may reflect atelectasis or mild interstitial edema. Electronically Signed   By: Roanna Raider M.D.   On: 04/02/2018 03:06     STUDIES:  CT the head negative  CULTURES: None  ANTIBIOTICS: None  SIGNIFICANT EVENTS: 04/02/2018 Place of service and intubation  LINES/TUBES: 04/02/2018 endotracheal tube>>  DISCUSSION: 27 year old male who presented independent hospital by private motor vehicle unconscious cyanotic but with a  palpable pulse.  He required his intubation independent hospital and was transferred to [facility] for further evaluation and treatment.  Upon arrival to [facility] he was awake alert follows commands was quickly transitioned to pressure support ventilation and was on minimal requirements.  We will continue to evaluate him for possible extubation.  Very little past medical history is available.  ASSESSMENT / PLAN:  PULMONARY A: Vent dependent respiratory failure secondary polysubstance abuse P:   Assess for possible extubation Ventilator  bundle  CARDIOVASCULAR A:  No acute issues P:  Intensive care unit cardiac hemodynamic monitoring  RENAL Lab Results  Component Value Date   CREATININE 0.90 04/02/2018   CREATININE 0.89 04/02/2018   CREATININE 0.90 01/24/2017   Recent Labs  Lab 04/02/18 0231 04/02/18 0255  K 3.7 3.1*    A:   Hypokalemia P:   Monitor and replete electrolytes  GASTROINTESTINAL A:   No acute issues P:   N.p.o. GI protection  HEMATOLOGIC Recent Labs    04/02/18 0231 04/02/18 0255  HGB 14.7 13.9    A:   No acute issues P:  DVT prophylaxis  INFECTIOUS A:   No overt infectious process P:   Monitor fever curve and WBC  ENDOCRINE A:   No acute issues P:   Monitor glucose  NEUROLOGIC A:   Altered mental status secondary to polysubstance abuse Negative head CT 04/02/2018 P:   RASS goal: 0 Stop all sedation Assess for extubation   FAMILY  - Updates: 04/02/2018 no family at bedside  - Inter-disciplinary family meet or Palliative Care meeting due by:  day 7    Pulmonary and Critical Care Medicine [facility] Pager: [phone]  04/02/2018, 7:28 AM

## 2018-04-02 NOTE — ED Triage Notes (Signed)
Pt brought in by girlfriend and states pt took an unknown amount of moonshine and she is not sure if pt took heroin; pt has agonal respirations

## 2018-04-02 NOTE — ED Notes (Signed)
Date and time results received: 04/02/18 2:57 AM   Test: Lactic  Critical Value:3.58  Name of Provider Notified: Rancour  Orders Received? Or Actions Taken?: MD notified

## 2018-04-02 NOTE — Progress Notes (Signed)
PCCM interval note  Notified by RN that the patient is stable post extubation.  He now indicates that he wants to leave the hospital today AMA.  I spoke with him at bedside.  He is awake, able to interact, able to comprehend his current status, the nights events and the ramifications of his actions.  I have recommended that he remain in the hospital for observation overnight, explained that he is at risk for evolving pneumonia based on the clinical scenario and also his chest x-ray.  He understands this but still wants to leave.  I have explained to him that I cannot keep him here but it that it would be against my medical advice.  Again he understands.  Based on his desire to leave I have counseled him to please seek medical care should he develop any new symptoms, particularly dyspnea, cough, fever, sputum production.  He seemed to understand these recommendations.  Levy Pupaobert Chaddrick Brue, MD, PhD 04/02/2018, 9:58 AM [facility] [phone] or if no answer [phone]

## 2018-04-04 LAB — URINE CULTURE: Culture: NO GROWTH

## 2018-04-20 NOTE — Discharge Summary (Signed)
PULMONARY / CRITICAL CARE MEDICINE   Name: Melton KrebsJoshua L Hinojosa MRN: [id number] DOB: 01/06/1991    ADMISSION DATE:  04/02/2018 Date of discharge: 04/02/2018  Final discharge diagnosis: Acute respiratory failure  Secondary discharge diagnoses: Polysubstance abuse Respiratory arrest due to narcotics Bibasilar atelectasis  [facility]  CHIEF COMPLAINT: Ventilator dependent respiratory failure secondary to polysubstance abuse  HISTORY OF PRESENT ILLNESS:   27 year old male who presented independent hospital by private motor vehicle unconscious cyanotic but with a palpable pulse.  He required his intubation independent hospital and was [facility] and treatment.  Upon arrival to [facility] he was awake alert follows commands was quickly transitioned to pressure support ventilation and was on minimal requirements.  We will continue to evaluate him for possible extubation.  Very little past medical history is available.  PAST MEDICAL HISTORY :  He  has a past medical history of Anxiety, Depression, Drug addiction (HCC) (2012), and Seizures (HCC).  PAST SURGICAL HISTORY: He  has a past surgical history that includes Esophagogastroduodenoscopy (N/A, 01/24/2013).  Allergies  Allergen Reactions  . Zithromax [Azithromycin] Other (See Comments)    Hands swelling  . Penicillins Itching and Rash    Has patient had a PCN reaction causing immediate rash, facial/tongue/throat swelling, SOB or lightheadedness with hypotension: No unknown Has patient had a PCN reaction causing severe rash involving mucus membranes or skin necrosis: No unknown Has patient had a PCN reaction that required hospitalization No Has patient had a PCN reaction occurring within the last 10 years: No If all of the above answers are "NO", then may proceed with Cephalosporin use.    No current facility-administered medications on file prior to  encounter.    Current Outpatient Medications on File Prior to Encounter  Medication Sig  . acetaminophen (TYLENOL) 325 MG tablet Take 650 mg by mouth every 6 (six) hours as needed for pain.  . pantoprazole (PROTONIX) 20 MG tablet Take 1 tablet (20 mg total) by mouth 2 (two) times daily.    FAMILY HISTORY:  His indicated that his mother is alive. He indicated that his father is alive. He indicated that his brother is alive.   SOCIAL HISTORY: He  reports that he has been smoking cigarettes.  He has a 2.25 pack-year smoking history. He has quit using smokeless tobacco. He reports that he drinks alcohol. He reports that he has current or past drug history.  REVIEW OF SYSTEMS:   Unavailable  SUBJECTIVE:  Currently on full mechanical ventilatory support but awake and follows commands  VITAL SIGNS: BP 98/61   Pulse 72   Temp (!) 96.6 F (35.9 C)   Resp 12   Ht 6\' 1"  (1.854 m)   Wt 85.3 kg (188 lb)   SpO2 98%   BMI 24.80 kg/m    STUDIES:  CT the head negative  CULTURES: None  ANTIBIOTICS: None  SIGNIFICANT EVENTS: 04/02/2018 Place of service and intubation  LINES/TUBES: 04/02/2018 endotracheal tube>>  DISCUSSION / Hospital course: 27 year old male who presented independent hospital by private motor vehicle unconscious cyanotic but with a palpable pulse.  He required his intubation independent hospital and was [facility] and treatment.  Upon arrival [facility] he was awake alert follows commands was quickly transitioned to pressure support ventilation and was on minimal requirements.  His UDS was positive for cocaine, narcotics, benzodiazepines.  Chest x-ray had bibasilar atelectasis versus infiltrates.  He was able to be extubated on 04/02/2018.  It was recommended to him that he  allow continued surveillance in case he were to develop an aspiration pneumonia for which he was at high risk.  Despite this he insisted on  leaving and he left AMA on 04/02/2018   Levy Pupa, MD, PhD 04/20/2018, 1:33 PM [facility] [phone] or if no answer [phone]

## 2019-02-01 IMAGING — CR DG CHEST 1V PORT
1 series · 1 of 1 positions shown · non-contrast
Comparison: Chest radiograph performed 10/06/2016

CLINICAL DATA: Endotracheal tube placement.

EXAM:
PORTABLE CHEST 1 VIEW

[portable]
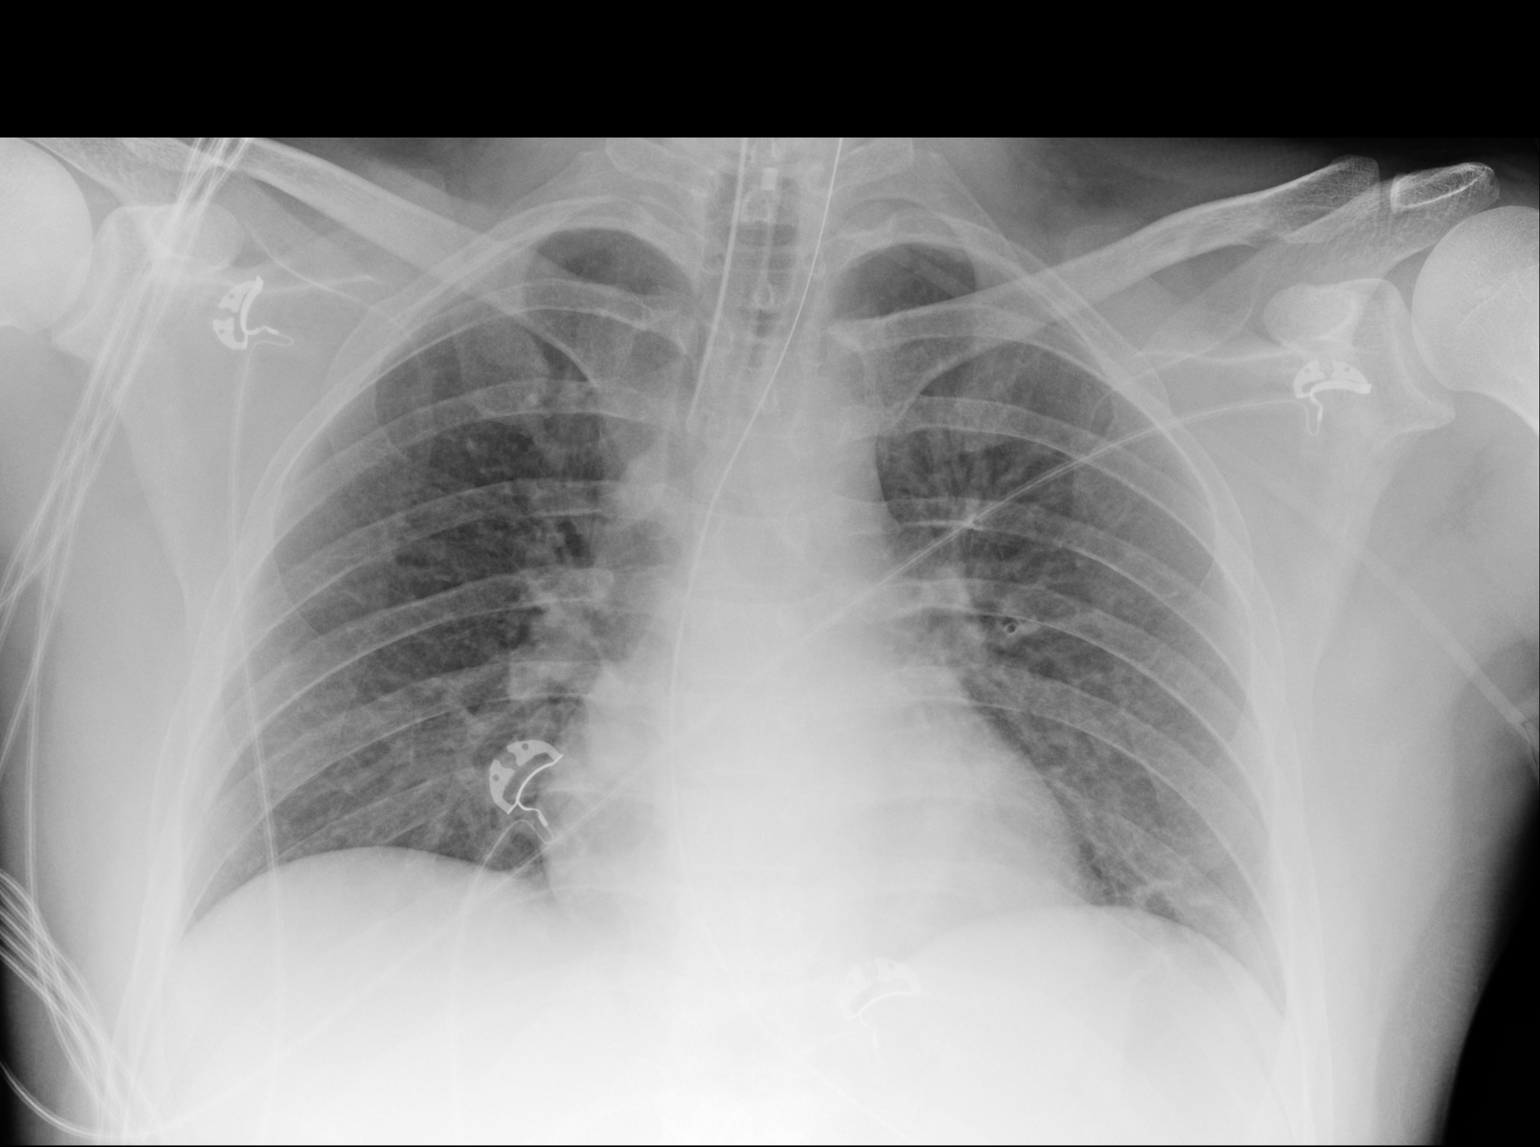

[1 of 1 positions shown; findings below may reference images not displayed]

FINDINGS: The patient's endotracheal tube is seen ending 2-3 cm above the
carina. The enteric tube is noted extending below the diaphragm.

The lungs are hypoexpanded. Vascular congestion is noted. Mildly
increased interstitial markings may reflect atelectasis or mild
interstitial edema. No pleural effusion or pneumothorax is seen.

The cardiomediastinal silhouette is normal in size. No acute osseous
abnormalities are seen.
IMPRESSION: 1. Endotracheal tube seen ending 2-3 cm above the carina.
2. Lungs hypoexpanded. Vascular congestion noted. Mildly increased
interstitial markings may reflect atelectasis or mild interstitial
edema.

## 2019-02-01 IMAGING — CT CT HEAD W/O CM
3 series · 15 of 47 positions shown, 18 images · non-contrast
Comparison: CT of the head performed 01/26/2009, and MRI of the
brain performed 01/27/2009

CLINICAL DATA: Took unknown amount of moonshine and may have taken
heroin. Altered level of consciousness and agonal respirations,
acute onset.

EXAM:
CT HEAD WITHOUT CONTRAST
TECHNIQUE: Contiguous axial images were obtained from the base of the skull
through the vertex without intravenous contrast.

[Series 2: head trauma wo · axial · 0.47mm/px · z∈[+1005,+1140]mm · 9 of 33 slices shown, 12 images]
[im 3/33  brain]
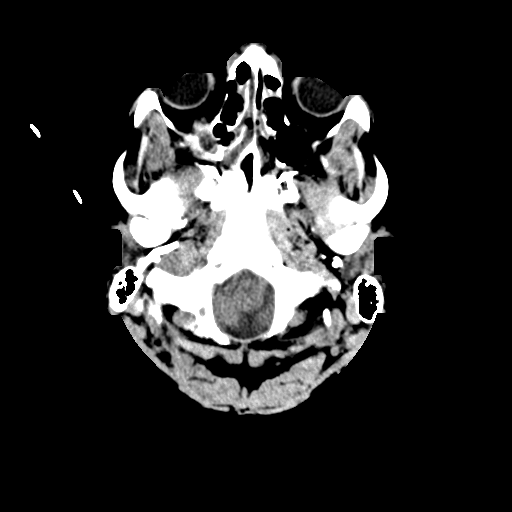
[im 3/33  bone]
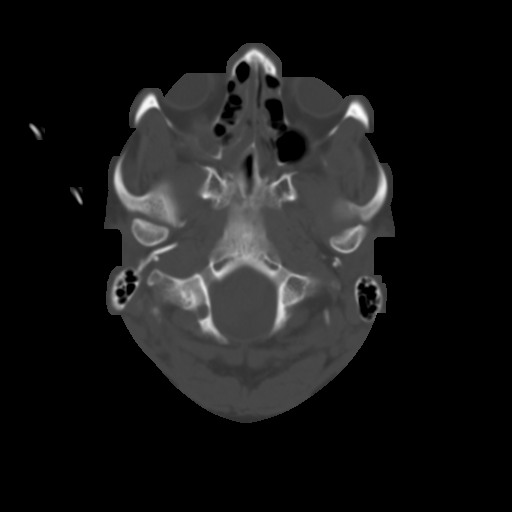
[im 6/33  brain]
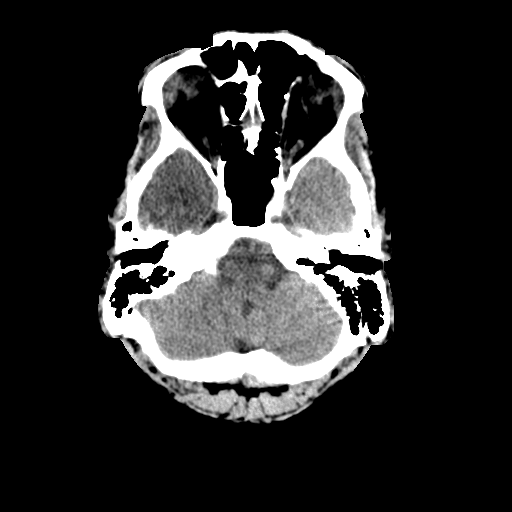
[im 9/33  brain]
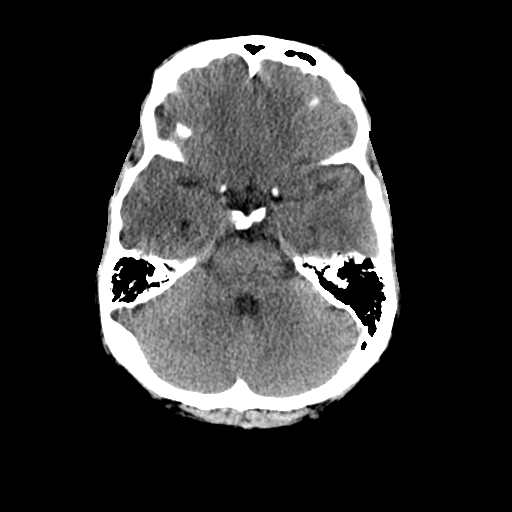
[im 13/33  brain]
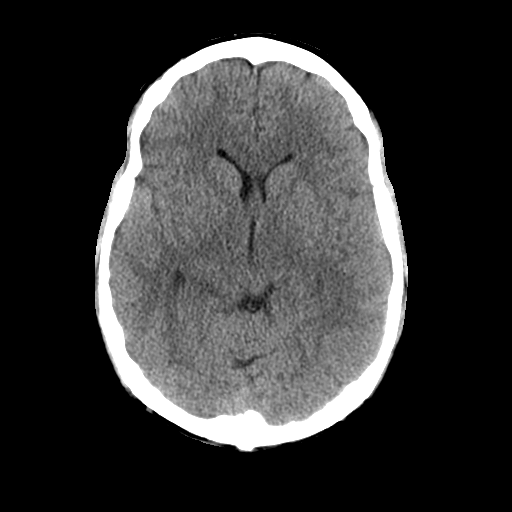
[im 17/33  brain]
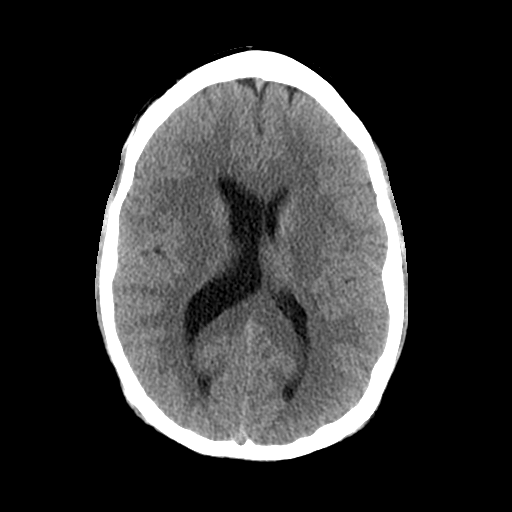
[im 17/33  bone]
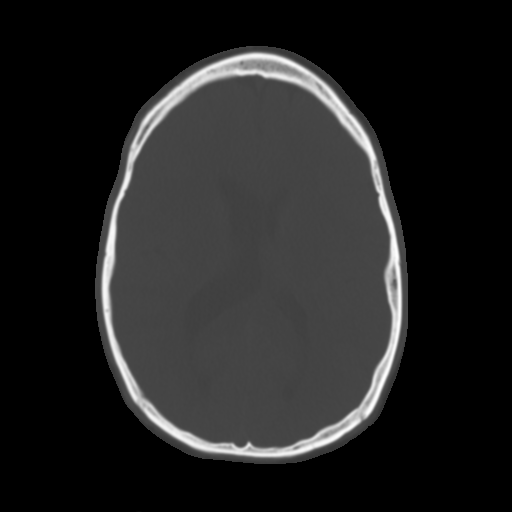
[im 20/33  brain]
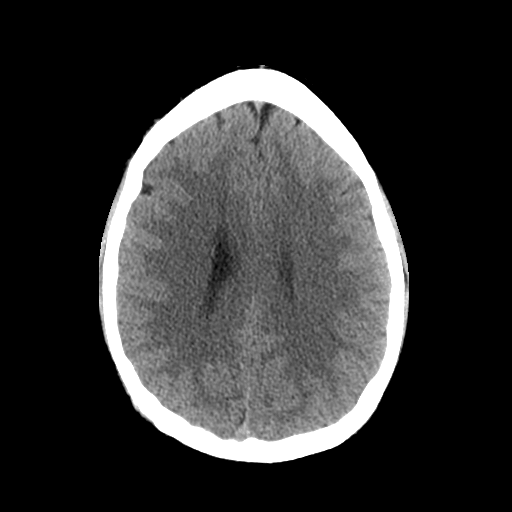
[im 24/33  brain]
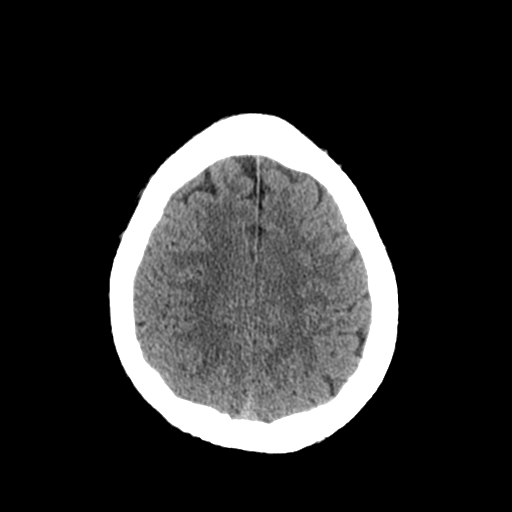
[im 27/33  brain]
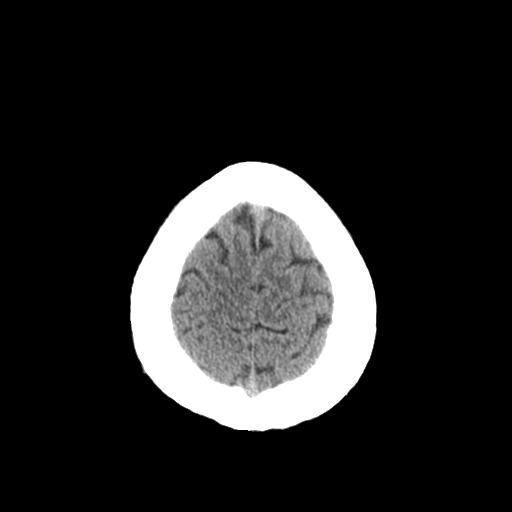
[im 30/33  brain]
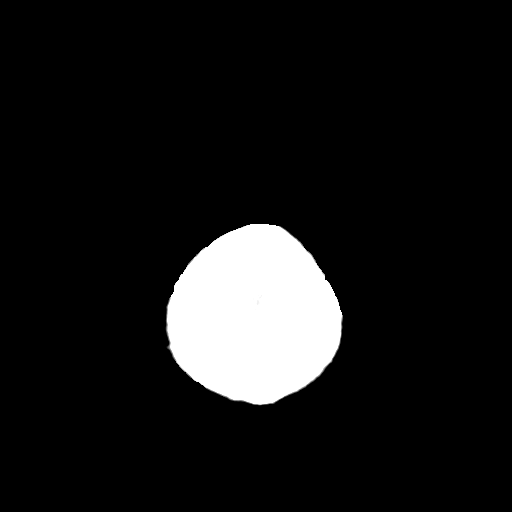
[im 30/33  bone]
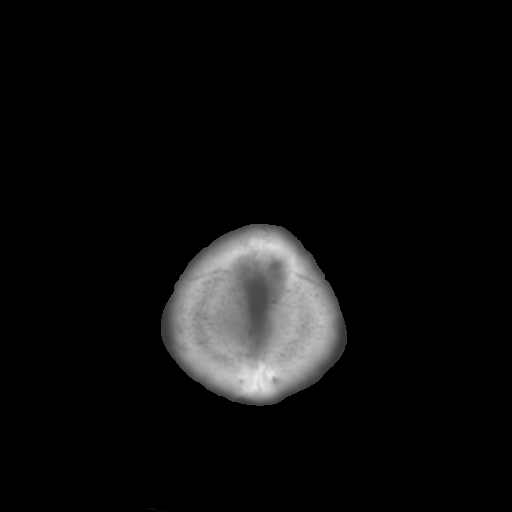

[Series 4: coronal soft tissue · coronal · 0.31mm/px · 3 of 68 slices shown]
[im 23/68  brain]
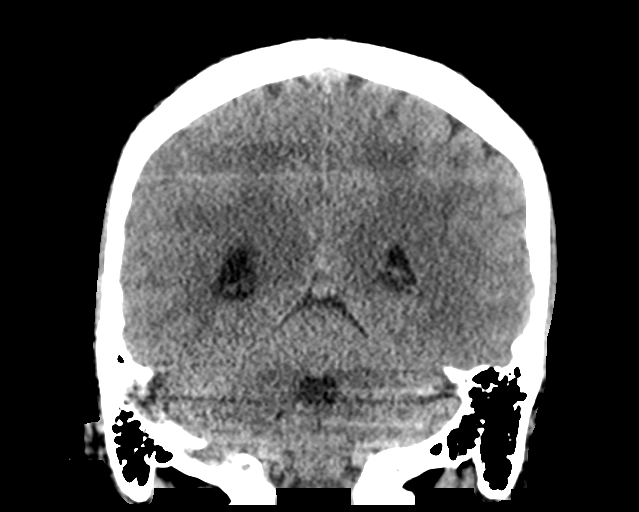
[im 30/68  brain]
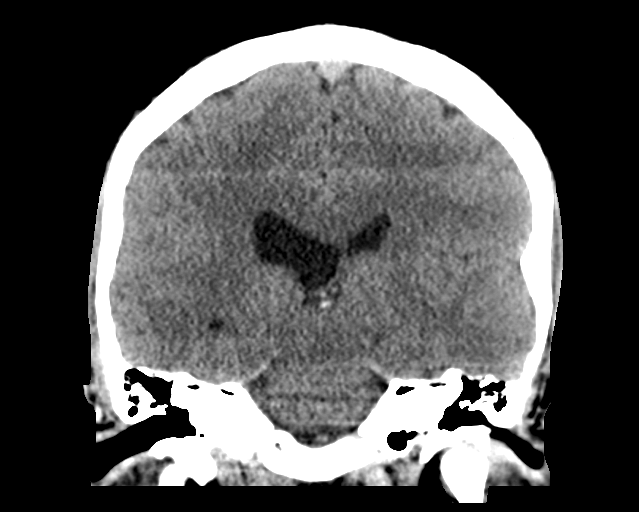
[im 38/68  brain]
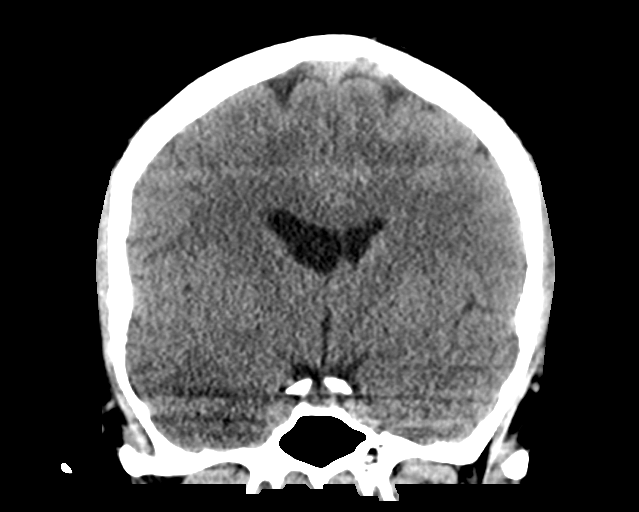

[Series 5: sagittal soft tissue · sagittal · 0.33mm/px · 3 of 55 slices shown]
[im 19/55  brain]
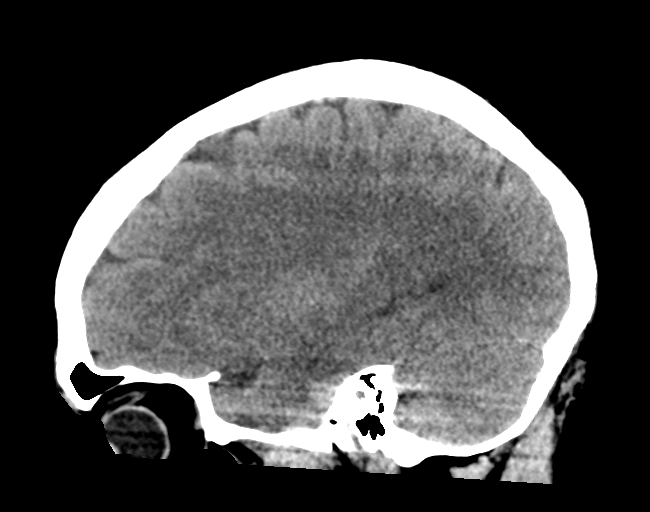
[im 28/55  brain]
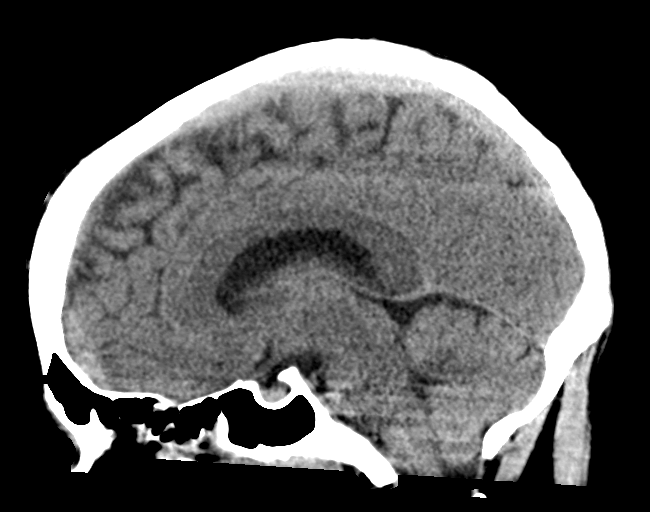
[im 37/55  brain]
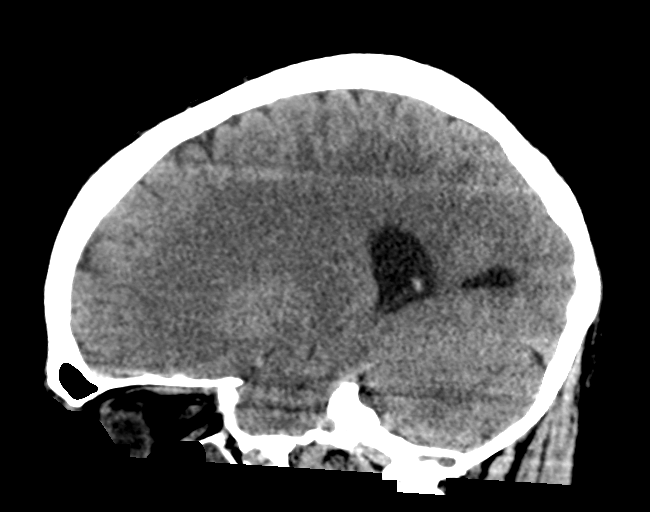

[15 of 47 positions shown; findings below may reference images not displayed]

FINDINGS: Brain: No evidence of acute infarction, hemorrhage, hydrocephalus,
extra-axial collection or mass lesion/mass effect.

The posterior fossa, including the cerebellum, brainstem and fourth
ventricle, is within normal limits. The third and lateral
ventricles, and basal ganglia are unremarkable in appearance. The
cerebral hemispheres are symmetric in appearance, with normal
gray-white differentiation. No mass effect or midline shift is seen.

Vascular: No hyperdense vessel or unexpected calcification.

Skull: There is no evidence of fracture; visualized osseous
structures are unremarkable in appearance.

Sinuses/Orbits: The visualized portions of the orbits are within
normal limits. There is opacification of the nasal passages, and
partial opacification of the right maxillary sinus. The remaining
paranasal sinuses and mastoid air cells are well-aerated.

Other: No significant soft tissue abnormalities are seen.
IMPRESSION: 1. No acute intracranial pathology seen on CT.
2. Opacification of the nasal passages, and partial opacification of
the right maxillary sinus.

## 2019-11-26 ENCOUNTER — Encounter: Payer: Self-pay | Admitting: Family Medicine

## 2024-10-30 ENCOUNTER — Encounter: Payer: Self-pay | Admitting: Orthopedic Surgery

## 2024-10-30 ENCOUNTER — Ambulatory Visit: Payer: MEDICAID | Admitting: Orthopedic Surgery

## 2024-10-30 DIAGNOSIS — S52531A Colles' fracture of right radius, initial encounter for closed fracture: Secondary | ICD-10-CM

## 2024-10-30 MED ORDER — OXYCODONE HCL 5 MG PO TABS: 5.0000 mg | ORAL_TABLET | Freq: Four times a day (QID) | ORAL | 0 refills | Status: DC | PRN

## 2024-10-30 NOTE — Progress Notes (Signed)
 New Patient Visit  Summary: [name] is a 34 y.o. male with the following: 1. Closed Colles' fracture of right radius, initial encounter   Assessment and Plan Assessment & Plan Displaced distal radius fracture of the right wrist Displaced distal radius fracture with intra-articular extension and dorsal angulation. Surgical fixation recommended to restore alignment and minimize functional limitations, pain, and post-traumatic arthritis risk. Smoking increases risk of delayed healing, nonunion, and malunion. Explained surgical risks and anticipated outcomes. - Scheduled ORIF with plate and screws for January 12th. - Preoperative appointment to be set up by hospital. - Provided perioperative and postoperative care instructions. - Postoperative immobilization: splint initially, cast for two weeks, then brace as indicated. - Pain management: additional preoperative pain medication, stronger postoperative medication, taper off narcotics by six weeks post-op. - Advised acetaminophen  or ibuprofen for daytime pain control. - Instructed to maintain current splint until surgery. - Provided paperwork with surgery date and instructions.  Nicotine dependence Nicotine use increases risk of delayed healing, nonunion, and malunion post-fracture fixation. Smoking is the only significant risk factor for healing complications. - Counseled on increased risk of healing complications due to smoking. - Encouraged smoking cessation to optimize surgical and fracture healing outcomes.     Follow-up: Return for After surgery.  Subjective:  Chief Complaint  Patient presents with   Fracture    R wrist after fall at skate rink DOI 10/27/24     Discussed the use of AI scribe software for clinical note transcription with the patient, who gave verbal consent to proceed.  History of Present Illness [name] is a 34 year old male who presents for evaluation of a displaced distal radius fracture of  the right wrist sustained three days ago.  He is left-hand dominant.  Three days ago, he fell backwards while roller skating and landed on his outstretched right hand. He felt an immediate pop in the right wrist and went to the emergency department, where the wrist was manually reduced and splinted. He was told surgery might be needed and was prescribed oxycodone  10 mg for pain.  Since the injury, he has had throbbing pain localized to the right wrist, worse in the mornings and at night. He denies numbness or tingling in the hand. He uses Tylenol  or ibuprofen during the day for pain. The splint has remained in place. He reports no other injuries. A bandage on one finger is from an older work injury and is asymptomatic.    Review of Systems: No fevers or chills No numbness or tingling No chest pain No shortness of breath No bowel or bladder dysfunction No GI distress No headaches   Medical History:  Past Medical History:  Diagnosis Date   Anxiety    Depression    Drug addiction (HCC) 2012   Seizures (HCC)     Past Surgical History:  Procedure Laterality Date   ESOPHAGOGASTRODUODENOSCOPY N/A 01/24/2013   Procedure: ESOPHAGOGASTRODUODENOSCOPY (EGD);  Surgeon: Claudis RAYMOND Rivet, MD;  [facility]: [facility];  Service: Endoscopy;  Laterality: N/A;  210    History reviewed. No pertinent family history. Social History[1]  Allergies[2]  Active Medications[3]  Objective: There were no vitals taken for this visit.  Physical Exam:    General: Alert and oriented. and No acute distress. Gait: Normal gait.  Physical Exam MUSCULOSKELETAL: Splint on the right wrist is clean, dry and intact.  He is in a sling.  Fingers are warm and well-perfused.  He is able to wiggle all exposed  fingers.  No skin breakdown at the periphery of the splint.  Sensation intact throughout the right hand.   IMAGING: I personally reviewed images previously obtained from the ED  Injury  films      Postreduction         New Medications:  Meds ordered this encounter  Medications   oxyCODONE  (ROXICODONE ) 5 MG immediate release tablet    Sig: Take 1 tablet (5 mg total) by mouth every 6 (six) hours as needed for up to 7 days.    Dispense:  20 tablet    Refill:  0      Portions of this note were completed via Scientist, clinical (histocompatibility and immunogenetics).  [name], MD  10/30/2024 9:22 AM      [1]  Social History Tobacco Use   Smoking status: Every Day    Current packs/day: 0.50    Average packs/day: 0.5 packs/day for 4.5 years (2.3 ttl pk-yrs)    Types: Cigarettes   Smokeless tobacco: Former   Tobacco comments:    1/2 pack a day x 3 1/2 yrs.  Substance Use Topics   Alcohol use: Yes    Comment: occasionally   Drug use: Yes    Comment: Hx of drug abuse. None x 1 yr  [2]  Allergies Allergen Reactions   Zithromax [Azithromycin] Other (See Comments)    Hands swelling   Penicillins Itching and Rash    Has patient had a PCN reaction causing immediate rash, facial/tongue/throat swelling, SOB or lightheadedness with hypotension: No unknown Has patient had a PCN reaction causing severe rash involving mucus membranes or skin necrosis: No unknown Has patient had a PCN reaction that required hospitalization No Has patient had a PCN reaction occurring within the last 10 years: No If all of the above answers are NO, then may proceed with Cephalosporin use.  [3]  Current Meds  Medication Sig   acetaminophen  (TYLENOL ) 325 MG tablet Take 650 mg by mouth every 6 (six) hours as needed for pain.   oxyCODONE  (ROXICODONE ) 5 MG immediate release tablet Take 1 tablet (5 mg total) by mouth every 6 (six) hours as needed for up to 7 days.   pantoprazole  (PROTONIX ) 20 MG tablet Take 1 tablet (20 mg total) by mouth 2 (two) times daily.

## 2024-10-30 NOTE — H&P (View-Only) (Signed)
 New Patient Visit  Summary: [name] is a 34 y.o. male with the following: 1. Closed Colles' fracture of right radius, initial encounter   Assessment and Plan Assessment & Plan Displaced distal radius fracture of the right wrist Displaced distal radius fracture with intra-articular extension and dorsal angulation. Surgical fixation recommended to restore alignment and minimize functional limitations, pain, and post-traumatic arthritis risk. Smoking increases risk of delayed healing, nonunion, and malunion. Explained surgical risks and anticipated outcomes. - Scheduled ORIF with plate and screws for January 12th. - Preoperative appointment to be set up by hospital. - Provided perioperative and postoperative care instructions. - Postoperative immobilization: splint initially, cast for two weeks, then brace as indicated. - Pain management: additional preoperative pain medication, stronger postoperative medication, taper off narcotics by six weeks post-op. - Advised acetaminophen  or ibuprofen for daytime pain control. - Instructed to maintain current splint until surgery. - Provided paperwork with surgery date and instructions.  Nicotine dependence Nicotine use increases risk of delayed healing, nonunion, and malunion post-fracture fixation. Smoking is the only significant risk factor for healing complications. - Counseled on increased risk of healing complications due to smoking. - Encouraged smoking cessation to optimize surgical and fracture healing outcomes.     Follow-up: Return for After surgery.  Subjective:  Chief Complaint  Patient presents with   Fracture    R wrist after fall [place] skate r[place] DOI 10/27/24     Discussed the use of AI scribe software for clinical note transcription with the patient, who gave verbal consent to proceed.  History of Present Illness [name] is a 34 year old male who presents for evaluation of a displaced distal radius fracture of  the right wrist sustained three days ago.  He is left-hand dominant.  Three days ago, he fell backwards while roller skating and landed on his outstretched right hand. He felt an immediate pop in the right wrist and went to the emergency department, where the wrist was manually reduced and splinted. He was told surgery might be needed and was prescribed oxycodone  10 mg for pain.  Since the injury, he has had throbbing pain localized to the right wrist, worse in the mornings and at night. He denies numbness or tingling in the hand. He uses Tylenol  or ibuprofen during the day for pain. The splint has remained in place. He reports no other injuries. A bandage on one finger is from an older work injury and is asymptomatic.    Review of Systems: No fevers or chills No numbness or tingling No chest pain No shortness of breath No bowel or bladder dysfunction No GI distress No headaches   Medical History:  Past Medical History:  Diagnosis Date   Anxiety    Depression    Drug addiction (HCC) 2012   Seizures (HCC)     Past Surgical History:  Procedure Laterality Date   ESOPHAGOGASTRODUODENOSCOPY N/A 01/24/2013   Procedure: ESOPHAGOGASTRODUODENOSCOPY (EGD);  Surgeon: Claudis RAYMOND Rivet, MD;  Location: [facility];  Service: Endoscopy;  Laterality: N/A;  210    History reviewed. No pertinent family history. Social History[1]  Allergies[2]  Active Medications[3]  Objective: There were no vitals taken for this visit.  Physical Exam:    General: Alert and oriented. and No acute distress. Gait: Normal gait.  Physical Exam MUSCULOSKELETAL: Splint on the right wrist is clean, dry and intact.  He is in a sling.  Fingers are warm and well-perfused.  He is able to wiggle all exposed  fingers.  No skin breakdown at the periphery of the splint.  Sensation intact throughout the right hand.   IMAGING: I personally reviewed images previously obtained from the ED  Injury  films      Postreduction         New Medications:  Meds ordered this encounter  Medications   oxyCODONE  (ROXICODONE ) 5 MG immediate release tablet    Sig: Take 1 tablet (5 mg total) by mouth every 6 (six) hours as needed for up to 7 days.    Dispense:  20 tablet    Refill:  0      Portions of this note were completed via Scientist, clinical (histocompatibility and immunogenetics).  [name], MD  10/30/2024 9:22 AM      [1]  Social History Tobacco Use   Smoking status: Every Day    Current packs/day: 0.50    Average packs/day: 0.5 packs/day for 4.5 years (2.3 ttl pk-yrs)    Types: Cigarettes   Smokeless tobacco: Former   Tobacco comments:    1/2 pack a day x 3 1/2 yrs.  Substance Use Topics   Alcohol use: Yes    Comment: occasionally   Drug use: Yes    Comment: Hx of drug abuse. None x 1 yr  [2]  Allergies Allergen Reactions   Zithromax [Azithromycin] Other (See Comments)    Hands swelling   Penicillins Itching and Rash    Has patient had a PCN reaction causing immediate rash, facial/tongue/throat swelling, SOB or lightheadedness with hypotension: No unknown Has patient had a PCN reaction causing severe rash involving mucus membranes or skin necrosis: No unknown Has patient had a PCN reaction that required hospitalization No Has patient had a PCN reaction occurring within the last 10 years: No If all of the above answers are NO, then may proceed with Cephalosporin use.  [3]  Current Meds  Medication Sig   acetaminophen  (TYLENOL ) 325 MG tablet Take 650 mg by mouth every 6 (six) hours as needed for pain.   oxyCODONE  (ROXICODONE ) 5 MG immediate release tablet Take 1 tablet (5 mg total) by mouth every 6 (six) hours as needed for up to 7 days.   pantoprazole  (PROTONIX ) 20 MG tablet Take 1 tablet (20 mg total) by mouth 2 (two) times daily.

## 2024-10-30 NOTE — Patient Instructions (Addendum)
 Preoperative Instructions  Your surgery will be at [facility], scheduled with Dr Oneil Horde   The hospital will contact you with a preoperative appointment to discuss Anesthesia. The phone number is [phone]   Please bring your medications with you for the appointment.  They will tell you the arrival time and medication instructions when you have your preoperative evaluation.  Do not wear nail polish the day of your surgery and if you take Phentermine you need to stop this medication ONE WEEK prior to your surgery.    If you take an blood thinning medication, we will need to stop this prior to surgery.  Typically, we stop this medicine at least 5 days prior to surgery.  We will need to confirm this with the doctor who prescribes this medication.  If you are taking medications or an injection for diabetes, or for weight management, this medicine will need to be stopped at least 7 days prior to surgery.     Surgery will be scheduled for 11/05/24 pending authorization by your insurance company.   Pain medicine policy:  Per [facility] clinic policy, our goal is ensure optimal postoperative pain control with a multimodal pain management strategy.   For all [facility] patients, our goal is to wean post-operative narcotic medications by 6 weeks post-operatively.   If this is not possible due to utilization of pain medication prior to surgery, your [facility] doctor will support your acute post-operative pain control for the first 6 weeks postoperatively, with a plan to transition you back to your primary pain team following that.   [name] will work to ensure a therapist, occupational.

## 2024-10-31 NOTE — Addendum Note (Signed)
 Addended by: VICENTA EMMIE HERO on: 10/31/2024 08:45 AM   Modules accepted: Orders

## 2024-11-01 ENCOUNTER — Encounter (HOSPITAL_COMMUNITY): Payer: Self-pay

## 2024-11-01 ENCOUNTER — Encounter (HOSPITAL_COMMUNITY)
Admission: RE | Admit: 2024-11-01 | Discharge: 2024-11-01 | Disposition: A | Discharge status: Home / Self Care | Payer: MEDICAID | Source: Ambulatory Visit | Attending: Orthopedic Surgery | Admitting: Orthopedic Surgery

## 2024-11-01 VITALS — BP 104/84 | HR 57 | Resp 18 | Ht 73.0 in | Wt 188.1 lb

## 2024-11-01 DIAGNOSIS — Z01812 Encounter for preprocedural laboratory examination: Secondary | ICD-10-CM | POA: Insufficient documentation

## 2024-11-01 DIAGNOSIS — Z01818 Encounter for other preprocedural examination: Secondary | ICD-10-CM

## 2024-11-01 LAB — BASIC METABOLIC PANEL WITH GFR
Anion gap: 14 (ref 5–15)
BUN: 19 mg/dL (ref 6–20)
CO2: 25 mmol/L (ref 22–32)
Calcium: 9.3 mg/dL (ref 8.9–10.3)
Chloride: 98 mmol/L (ref 98–111)
Creatinine, Ser: 0.69 mg/dL (ref 0.61–1.24)
GFR, Estimated: >60 mL/min
Glucose, Bld: 82 mg/dL (ref 70–99)
Potassium: 4.4 mmol/L (ref 3.5–5.1)
Sodium: 137 mmol/L (ref 135–145)

## 2024-11-01 LAB — CBC
HCT: 37.5 % — ABNORMAL LOW (ref 39.0–52.0)
Hemoglobin: 13.3 g/dL (ref 13.0–17.0)
MCH: 30.9 pg (ref 26.0–34.0)
MCHC: 35.5 g/dL (ref 30.0–36.0)
MCV: 87.2 fL (ref 80.0–100.0)
Platelets: 239 K/uL (ref 150–400)
RBC: 4.3 MIL/uL (ref 4.22–5.81)
RDW: 11.7 % (ref 11.5–15.5)
WBC: 6.4 K/uL (ref 4.0–10.5)
nRBC: 0 % (ref 0.0–0.2)

## 2024-11-01 LAB — URINE DRUG SCREEN
Amphetamines: NEGATIVE
Barbiturates: NEGATIVE
Benzodiazepines: POSITIVE — AB
Cocaine: NEGATIVE
Fentanyl: NEGATIVE
Methadone Scn, Ur: NEGATIVE
Opiates: NEGATIVE
Tetrahydrocannabinol: POSITIVE — AB

## 2024-11-01 NOTE — Patient Instructions (Signed)
" ° °   [name]  11/01/2024     @PREFPERIOPPHARMACY @   Your procedure is scheduled on 11/05/2024.  Report to [facility] at 0700 A.M.  Call this number if you have problems the morning of surgery:  [phone]  If you experience any cold or flu symptoms such as cough, fever, chills, shortness of breath, etc. between now and your scheduled surgery, please notify us  at the above number.   Remember:  Do not eat or drink after midnight.  You may drink clear liquids until 0300 .  Clear liquids allowed are:                    Water     Take these medicines the morning of surgery with A SIP OF WATER   oxycodone     Do not wear jewelry, make-up or nail polish, including gel polish,  artificial nails, or any other type of covering on natural nails (fingers and  toes).  Do not wear lotions, powders, or perfumes, or deodorant.  Do not shave 48 hours prior to surgery.  Men may shave face and neck.  Do not bring valuables to the hospital.  [facility] is not responsible for any belongings or valuables.  Contacts, dentures or bridgework may not be worn into surgery.  Leave your suitcase in the car.  After surgery it may be brought to your room.  For patients admitted to the hospital, discharge time will be determined by your treatment team.  Patients discharged the day of surgery will not be allowed to drive home. Someone will need to stay with you for 24 hours.   Please read over the following fact sheets that you were given. Anesthesia Post-op Instructions Incentive spirometer Body wash instructions              "

## 2024-11-02 NOTE — Progress Notes (Signed)
 Patient surgery arrival time of 0700 changed to 0615 for Monday 11/05/24 due to changes in the OR schedule.  Called patient and he wasn't available but his wife confirmed his procedure and stated she would be his driver.  Informed her of new arrival time, she voiced understanding.

## 2024-11-05 ENCOUNTER — Encounter (HOSPITAL_COMMUNITY): Payer: Self-pay | Admitting: Orthopedic Surgery

## 2024-11-05 ENCOUNTER — Ambulatory Visit (HOSPITAL_COMMUNITY): Payer: MEDICAID

## 2024-11-05 ENCOUNTER — Encounter: Admission: RE | Payer: Self-pay

## 2024-11-05 ENCOUNTER — Ambulatory Visit (HOSPITAL_COMMUNITY): Payer: MEDICAID | Admitting: Anesthesiology

## 2024-11-05 ENCOUNTER — Ambulatory Visit (HOSPITAL_COMMUNITY): Admission: RE | Admit: 2024-11-05 | Payer: MEDICAID | Admitting: Orthopedic Surgery

## 2024-11-05 ENCOUNTER — Encounter (HOSPITAL_COMMUNITY): Payer: MEDICAID | Admitting: Anesthesiology

## 2024-11-05 DIAGNOSIS — F17210 Nicotine dependence, cigarettes, uncomplicated: Secondary | ICD-10-CM | POA: Diagnosis not present

## 2024-11-05 DIAGNOSIS — F32A Depression, unspecified: Secondary | ICD-10-CM | POA: Diagnosis not present

## 2024-11-05 DIAGNOSIS — I10 Essential (primary) hypertension: Secondary | ICD-10-CM | POA: Insufficient documentation

## 2024-11-05 DIAGNOSIS — K219 Gastro-esophageal reflux disease without esophagitis: Secondary | ICD-10-CM | POA: Diagnosis not present

## 2024-11-05 DIAGNOSIS — F419 Anxiety disorder, unspecified: Secondary | ICD-10-CM | POA: Diagnosis not present

## 2024-11-05 DIAGNOSIS — S52531A Colles' fracture of right radius, initial encounter for closed fracture: Secondary | ICD-10-CM | POA: Insufficient documentation

## 2024-11-05 HISTORY — PX: OPEN REDUCTION INTERNAL FIXATION (ORIF) DISTAL RADIAL FRACTURE: SHX5989

## 2024-11-05 MED ORDER — PROPOFOL 10 MG/ML IV BOLUS
INTRAVENOUS | Status: AC
  Filled 2024-11-05: qty 20

## 2024-11-05 MED ORDER — BUPIVACAINE-EPINEPHRINE (PF) 0.5% -1:200000 IJ SOLN
INTRAMUSCULAR | Status: AC
  Filled 2024-11-05: qty 30

## 2024-11-05 MED ORDER — MIDAZOLAM HCL (PF) 2 MG/2ML IJ SOLN
INTRAMUSCULAR | Status: DC | PRN
  Administered 2024-11-05: 2 mg via INTRAVENOUS

## 2024-11-05 MED ORDER — OXYCODONE HCL 5 MG/5ML PO SOLN: 5.0000 mg | Freq: Once | ORAL | Status: AC | PRN

## 2024-11-05 MED ORDER — PROPOFOL 500 MG/50ML IV EMUL
INTRAVENOUS | Status: AC
  Filled 2024-11-05: qty 50

## 2024-11-05 MED ORDER — EPHEDRINE 5 MG/ML INJ
INTRAVENOUS | Status: AC
  Filled 2024-11-05: qty 5

## 2024-11-05 MED ORDER — OXYCODONE HCL 5 MG PO TABS
5.0000 mg | ORAL_TABLET | Freq: Once | ORAL | Status: AC | PRN
  Administered 2024-11-05: 5 mg via ORAL
  Filled 2024-11-05: qty 1

## 2024-11-05 MED ORDER — DEXAMETHASONE SOD PHOSPHATE PF 10 MG/ML IJ SOLN
INTRAMUSCULAR | Status: DC | PRN
  Administered 2024-11-05: 5 mg via INTRAVENOUS

## 2024-11-05 MED ORDER — LIDOCAINE HCL (CARDIAC) PF 100 MG/5ML IV SOSY
PREFILLED_SYRINGE | INTRAVENOUS | Status: DC | PRN
  Administered 2024-11-05: 1 mL via INTRAVENOUS

## 2024-11-05 MED ORDER — FENTANYL CITRATE (PF) 50 MCG/ML IJ SOSY
PREFILLED_SYRINGE | INTRAMUSCULAR | Status: AC
  Filled 2024-11-05: qty 1

## 2024-11-05 MED ORDER — BUPIVACAINE-EPINEPHRINE (PF) 0.5% -1:200000 IJ SOLN
INTRAMUSCULAR | Status: DC | PRN
  Administered 2024-11-05: 30 mL

## 2024-11-05 MED ORDER — DEXMEDETOMIDINE HCL IN NACL 80 MCG/20ML IV SOLN
INTRAVENOUS | Status: DC | PRN
  Administered 2024-11-05: 4 ug via INTRAVENOUS
  Administered 2024-11-05 (×2): 8 ug via INTRAVENOUS

## 2024-11-05 MED ORDER — DEXAMETHASONE SOD PHOSPHATE PF 10 MG/ML IJ SOLN
INTRAMUSCULAR | Status: AC
  Filled 2024-11-05: qty 3

## 2024-11-05 MED ORDER — CEFAZOLIN SODIUM-DEXTROSE 2-4 GM/100ML-% IV SOLN
2.0000 g | INTRAVENOUS | Status: AC
  Administered 2024-11-05: 2 g via INTRAVENOUS
  Filled 2024-11-05: qty 100

## 2024-11-05 MED ORDER — FENTANYL CITRATE (PF) 100 MCG/2ML IJ SOLN
INTRAMUSCULAR | Status: AC
  Filled 2024-11-05: qty 2

## 2024-11-05 MED ORDER — ROPIVACAINE HCL 5 MG/ML IJ SOLN
INTRAMUSCULAR | Status: DC | PRN
  Administered 2024-11-05: 15 mL via PERINEURAL
  Administered 2024-11-05: 5 mL via PERINEURAL

## 2024-11-05 MED ORDER — PHENYLEPHRINE 80 MCG/ML (10ML) SYRINGE FOR IV PUSH (FOR BLOOD PRESSURE SUPPORT)
PREFILLED_SYRINGE | INTRAVENOUS | Status: AC
  Filled 2024-11-05: qty 10

## 2024-11-05 MED ORDER — FENTANYL CITRATE (PF) 50 MCG/ML IJ SOSY
25.0000 ug | PREFILLED_SYRINGE | INTRAMUSCULAR | Status: DC | PRN
  Administered 2024-11-05 (×3): 50 ug via INTRAVENOUS
  Filled 2024-11-05 (×2): qty 1

## 2024-11-05 MED ORDER — DEXMEDETOMIDINE HCL IN NACL 80 MCG/20ML IV SOLN
INTRAVENOUS | Status: AC
  Filled 2024-11-05: qty 20

## 2024-11-05 MED ORDER — OXYCODONE HCL 5 MG PO TABS: 5.0000 mg | ORAL_TABLET | ORAL | 0 refills | Status: AC | PRN

## 2024-11-05 MED ORDER — MIDAZOLAM HCL 2 MG/2ML IJ SOLN
INTRAMUSCULAR | Status: AC
  Filled 2024-11-05: qty 2

## 2024-11-05 MED ORDER — LACTATED RINGERS IV SOLN: INTRAVENOUS | Status: DC

## 2024-11-05 MED ORDER — LIDOCAINE 2% (20 MG/ML) 5 ML SYRINGE
INTRAMUSCULAR | Status: AC
  Filled 2024-11-05: qty 5

## 2024-11-05 MED ORDER — CELECOXIB 100 MG PO CAPS: 100.0000 mg | ORAL_CAPSULE | Freq: Every day | ORAL | 0 refills | Status: AC

## 2024-11-05 MED ORDER — ONDANSETRON HCL 4 MG/2ML IJ SOLN: 4.0000 mg | Freq: Once | INTRAMUSCULAR | Status: DC | PRN

## 2024-11-05 MED ORDER — GLYCOPYRROLATE PF 0.2 MG/ML IJ SOSY
PREFILLED_SYRINGE | INTRAMUSCULAR | Status: DC | PRN
  Administered 2024-11-05: .1 mg via INTRAVENOUS

## 2024-11-05 MED ORDER — ONDANSETRON HCL 4 MG/2ML IJ SOLN
INTRAMUSCULAR | Status: DC | PRN
  Administered 2024-11-05: 4 mg via INTRAVENOUS

## 2024-11-05 MED ORDER — ONDANSETRON HCL 4 MG PO TABS: 4.0000 mg | ORAL_TABLET | Freq: Three times a day (TID) | ORAL | 0 refills | Status: AC | PRN

## 2024-11-05 MED ORDER — GLYCOPYRROLATE PF 0.2 MG/ML IJ SOSY
PREFILLED_SYRINGE | INTRAMUSCULAR | Status: AC
  Filled 2024-11-05: qty 1

## 2024-11-05 MED ORDER — ORAL CARE MOUTH RINSE: 15.0000 mL | Freq: Once | OROMUCOSAL | Status: AC

## 2024-11-05 MED ORDER — SODIUM CHLORIDE 0.9 % IR SOLN
Status: DC | PRN
  Administered 2024-11-05: 1

## 2024-11-05 MED ORDER — CHLORHEXIDINE GLUCONATE 0.12 % MT SOLN
15.0000 mL | Freq: Once | OROMUCOSAL | Status: AC
  Administered 2024-11-05: 15 mL via OROMUCOSAL
  Filled 2024-11-05: qty 15

## 2024-11-05 MED ORDER — FENTANYL CITRATE (PF) 100 MCG/2ML IJ SOLN
INTRAMUSCULAR | Status: DC | PRN
  Administered 2024-11-05: 25 ug via INTRAVENOUS
  Administered 2024-11-05 (×2): 50 ug via INTRAVENOUS
  Administered 2024-11-05 (×3): 25 ug via INTRAVENOUS

## 2024-11-05 MED ORDER — PROPOFOL 10 MG/ML IV BOLUS
INTRAVENOUS | Status: DC | PRN
  Administered 2024-11-05: 200 mg via INTRAVENOUS

## 2024-11-05 MED ORDER — ONDANSETRON HCL 4 MG/2ML IJ SOLN
INTRAMUSCULAR | Status: AC
  Filled 2024-11-05: qty 6

## 2024-11-05 MED ORDER — ROPIVACAINE HCL 5 MG/ML IJ SOLN
INTRAMUSCULAR | Status: AC
  Filled 2024-11-05: qty 30

## 2024-11-05 MED ORDER — ACETAMINOPHEN 500 MG PO TABS: 1000.0000 mg | ORAL_TABLET | Freq: Three times a day (TID) | ORAL | 0 refills | Status: AC

## 2024-11-05 NOTE — Anesthesia Procedure Notes (Addendum)
 Procedure Name: LMA Insertion Date/Time: 11/05/2024 8:14 AM  Performed by: Barbarann Verneita RAMAN, CRNAPre-anesthesia Checklist: Patient identified, Patient being monitored, Emergency Drugs available, Timeout performed and Suction available Patient Re-evaluated:Patient Re-evaluated prior to induction Oxygen Delivery Method: Circle System Utilized Preoxygenation: Pre-oxygenation with 100% oxygen Induction Type: IV induction Ventilation: Mask ventilation without difficulty LMA: LMA inserted LMA Size: 4.0 Number of attempts: 1 Placement Confirmation: positive ETCO2 and breath sounds checked- equal and bilateral Tube secured with: Tape Dental Injury: Teeth and Oropharynx as per pre-operative assessment

## 2024-11-05 NOTE — Op Note (Signed)
 Orthopaedic Surgery Operative Note (CSN: [id number])  [name]  11-25-1990 Date of Surgery: 11/05/2024   Diagnoses:  right distal radius fracture  Procedure: ORIF of right distal radius fracture, with intra-articular extension   Operative Finding Successful completion of the planned procedure.    Post-Op Diagnosis: Same Surgeons:Primary: Onesimo Oneil LABOR, MD Assistants: Montie Seltzer Location: [facility] OR ROOM 4 Anesthesia: General with local anesthesia Antibiotics: Ancef  2 g Tourniquet time:  Total Tourniquet Time Documented: Upper Arm (Right) - 95 minutes Total: Upper Arm (Right) - 95 minutes  Estimated Blood Loss: 20 cc Complications: None Specimens: None  Implants: Implant Name Type Inv. Item Serial No. Manufacturer Lot No. LRB No. Used Action  PLATE DISTAL RAD VOL RT 3H - [id number] Plate PLATE DISTAL RAD VOL RT 3H  [employer]  Right 1 Implanted  SCREW NLOCK 2.4X22 - [id number] Screw SCREW NLOCK 2.4X22  [employer]  Right 3 Implanted  SCREW CORT 3.5X18 THRD - [id number] Screw SCREW CORT 3.5X18 THRD  [employer]  Right 1 Implanted  SCREW CORT TI FT ANKLE 3.5X20 - [id number] Screw SCREW CORT TI FT ANKLE 3.5X20  [employer]  Right 2 Implanted  SCREW KREULOCK 2.4X20 - [id number] Screw SCREW KREULOCK 2.4X20  [employer]  Right 2 Implanted  SCREW LOCK 18X2.4XVA NS LF TI - [id number] Screw SCREW LOCK 18X2.4XVA NS LF TI  [employer]  Right 1 Implanted    Indications for Surgery:   [name] is a 34 y.o. male who fell roller skating and sustained a comminuted, angulated distal right radius fracture.  Given his age, and level of function, I recommended operative fixation.  Procedure was discussed in detail.  Benefits and risks of operative and nonoperative management were discussed prior to surgery with the patient and informed consent form was completed.  Specific risks including infection, need for additional surgery, bleeding, nonunion, malunion, stiffness, persistent pain,  need for hardware removal and more severe complications associate with anesthesia were discussed.  All questions have been answered.  He elects to proceed.   Procedure:   The patient was identified properly. Informed consent was obtained and the surgical site was marked. The patient was taken to the OR where general anesthesia was induced.  The patient was positioned supine on a hand table.  The right arm was prepped and draped in the usual sterile fashion.  Timeout was performed before the beginning of the case.  Tourniquet was used for the above duration.  Patient received 2 g of Ancef  prior to making incision.  We made an incision directly overlying the FCR tendon.  We proceeded with a standard volar approach to expose the volar aspect of the distal radius.  We carefully protected the radial artery on the radial forearm and the median nerve in the ulnar aspect of the incision.  The fracture site was identified.  Some callus had formed.  This was debrided with a rongeur and irrigation.  Fracture site was cleared with rongeurs, curette and irrigation.  Fracture was then reduced.  This was confirmed under fluoroscopy.  We selected the above stated plate from the back table.  This was provisionally placed in the volar aspect of the distal radius.  Placement was confirmed under fluoroscopy.  Multiple screws were placed in the distal fragment and then the proximal portion of the plate was secured to the radial shaft with bicortical screws.  We achieved excellent purchase.  We had an excellent reduction.  This was confirmed under fluoroscopy.  Volar  tilt was restored.  We then placed multiple additional locking screws in the distal cluster.  We placed 2 radial styloid screws.  We then placed additional screws in the shaft to secure the plate.  Final fluoroscopic imaging confirmed placement of all screws, as well as reduction of the fracture.  No screws were within the joint.  Shaft screws were adjusted  appropriately.  All K wires, and screw towers were removed from the plate.  We irrigated the wound copiously.  We closed the incision in a multilayer fashion with absorbable suture.  Sterile dressing was placed followed by a well padded splint.  Patient was awoken taken to PACU in stable condition.   Post-operative plan:  The patient will be nonweightbearing in the right upper extremity.  To be discharged from the PACU once he is sufficiently recovered. DVT prophylaxis not indicated in this ambulatory upper extremity patient without significant risk factors.    Pain control with PRN pain medication preferring oral medicines.   Follow up plan will be scheduled in approximately 10-14 days for incision check and XR.

## 2024-11-05 NOTE — Interval H&P Note (Signed)
 History and Physical Interval Note:  11/05/2024 7:59 AM  [name]  has presented today for surgery, with the diagnosis of right distal radius fracture.  The various methods of treatment have been discussed with the patient and family. After consideration of risks, benefits and other options for treatment, the patient has consented to  Procedures: OPEN REDUCTION INTERNAL FIXATION (ORIF) DISTAL RADIUS FRACTURE (Right) as a surgical intervention.  The patient's history has been reviewed, patient examined, no change in status, stable for surgery.  I have reviewed the patient's chart and labs.  Questions were answered to the patient's satisfaction.     [name]

## 2024-11-05 NOTE — Discharge Instructions (Signed)
 " Miguel Schmid A. Onesimo, MD MS [facility] [address] [place],  [place]  [address] Phone: [phone] Fax: [phone]   POST-OPERATIVE INSTRUCTIONS - UPPER EXTREMITY   WOUND CARE Please keep splint clean dry and intact until followup.  You may shower on Post-Op Day #2.  You must keep splint dry during this process and may find that a plastic bag taped around the arm or alternatively a towel based bath may be a better option.   If you get your splint wet or if it is damaged please contact our clinic.  EXERCISES Due to your splint being in place you will not be able to bear weight through your extremity.   DO NOT PUT ANY WEIGHT ON YOUR OPERATIVE ARM   REGIONAL ANESTHESIA (NERVE BLOCKS) The anesthesia team may have performed a nerve block for you if safe in the setting of your care.  This is a great tool used to minimize pain.  Typically the block may start wearing off overnight but the long acting medicine may last for 3-4 days.  The nerve block wearing off can be a challenging period but please utilize your as needed pain medications to try and manage this period.    POST-OP MEDICATIONS- Multimodal approach to pain control  In general your pain will be controlled with a combination of substances.  Prescriptions unless otherwise discussed are electronically sent to your pharmacy.  This is a carefully made plan we use to minimize narcotic use.     - Meloxicam OR Celebrex  - Anti-inflammatory medication taken on a scheduled basis  - Acetaminophen  - Non-narcotic pain medicine taken on a scheduled basis   - Oxycodone  - This is a strong narcotic, to be used only on an as needed basis for pain.  -  Zofran  - take as needed for nausea   FOLLOW-UP If you develop a Fever (>101.5), Redness or Drainage from the surgical incision site, please call our office to arrange for an evaluation. Please call the office to schedule a follow-up appointment for your incision check  if you do not already have one, 10-14 days post-operatively.  IF YOU HAVE ANY QUESTIONS, PLEASE FEEL FREE TO CALL OUR OFFICE.  HELPFUL INFORMATION  If you had a block, it will wear off between 8-24 hrs postop typically.  This is period when your pain may go from nearly zero to the pain you would have had postop without the block.  This is an abrupt transition but nothing dangerous is happening.  You may take an extra dose of narcotic when this happens.  You should wean off your narcotic medicines as soon as you are able.  Most patients will be off or using minimal narcotics before their first postop appointment.   Elevating your leg will help with swelling and pain control.  You are encouraged to elevate your leg as much as possible in the first couple of weeks following surgery.  Imagine a drop of water  on your toe, and your goal is to get that water  back to your heart.  We suggest you use the pain medication the first night prior to going to bed, in order to ease any pain when the anesthesia wears off. You should avoid taking pain medications on an empty stomach as it will make you nauseous.  Do not drink alcoholic beverages or take illicit drugs when taking pain medications.  In most states it is against the law to drive while you are in a splint  or sling.  And certainly against the law to drive while taking narcotics.  You may return to work/school in the next couple of days when you feel up to it.   Pain medication may make you constipated.  Below are a few solutions to try in this order: Decrease the amount of pain medication if you arent having pain. Drink lots of decaffeinated fluids. Drink prune juice and/or each dried prunes  If the first 3 dont work start with additional solutions Take Colace - an over-the-counter stool softener Take Senokot - an over-the-counter laxative Take Miralax - a stronger over-the-counter laxative   Narcotic Management   Per [facility] clinic  policy, our goal is ensure optimal postoperative pain control with a multimodal pain management strategy.   For all [facility] patients, our goal is to wean post-operative narcotic medications by 6 weeks post-operatively.   If this is not possible due to utilization of pain medication prior to surgery, your [name] doctor will support your acute post-operative pain control for the first 6 weeks postoperatively, with a plan to transition you back to your primary pain team following that. [name] will work to ensure a therapist, occupational.    "

## 2024-11-05 NOTE — Transfer of Care (Signed)
 Immediate Anesthesia Transfer of Care Note  Patient: [name]  Procedure(s) Performed: OPEN REDUCTION INTERNAL FIXATION (ORIF) DISTAL RADIUS FRACTURE (Right: Arm Lower)  Patient Location: PACU  Anesthesia Type:General  Level of Consciousness: awake and patient cooperative  Airway & Oxygen Therapy: Patient Spontanous Breathing  Post-op Assessment: Report given to RN and Post -op Vital signs reviewed and stable  Post vital signs: Reviewed and stable  Last Vitals:  Vitals Value Taken Time  BP 110/63 11/05/24 10:16  Temp 98.4 01/12/20206  1019  Pulse 72 11/05/24 10:18  Resp 14 11/05/24 10:18  SpO2 99 % 11/05/24 10:18  Vitals shown include unfiled device data.  Last Pain:  Vitals:   11/05/24 0636  TempSrc: Oral  PainSc: 5       Patients Stated Pain Goal: 6 (11/05/24 0636)  Complications: No notable events documented.

## 2024-11-05 NOTE — Progress Notes (Signed)
 Supraclavicular Block performed with 16cc lidocaine .  Patient tolerated procedure well.

## 2024-11-05 NOTE — Anesthesia Procedure Notes (Signed)
 Anesthesia Regional Block: Supraclavicular block   Pre-Anesthetic Checklist: , timeout performed,  Correct Patient, Correct Site, Correct Laterality,  Correct Procedure, Correct Position, site marked,  Risks and benefits discussed,  Surgical consent,  Pre-op evaluation,  Post-op pain management  Laterality: Right  Prep: chloraprep       Needles:  Injection technique: Single-shot  Needle Type: Stimiplex     Needle Length: 10cm  Needle Gauge: 22     Additional Needles:   Procedures:, nerve stimulator,,, ultrasound used (permanent image in chart),,    Narrative:  Start time: 11/05/2024 11:06 AM End time: 11/05/2024 11:10 AM Injection made incrementally with aspirations every 5 mL.  Performed by: With CRNAs  Anesthesiologist: Pheobe Adine CROME, CRNA CRNA: Kendell Yvonna PARAS, MD

## 2024-11-05 NOTE — Anesthesia Preprocedure Evaluation (Signed)
"                                    Anesthesia Evaluation  Patient identified by MRN, date of birth, ID band Patient awake    Reviewed: Allergy & Precautions, H&P , NPO status , Patient's Chart, lab work & pertinent test results, reviewed documented beta blocker date and time   Airway Mallampati: II  TM Distance: >3 FB Neck ROM: full    Dental no notable dental hx.    Pulmonary neg pulmonary ROS, Current Smoker and Patient abstained from smoking.   Pulmonary exam normal breath sounds clear to auscultation       Cardiovascular Exercise Tolerance: Good hypertension, negative cardio ROS  Rhythm:regular Rate:Normal     Neuro/Psych Seizures -,  PSYCHIATRIC DISORDERS Anxiety Depression       GI/Hepatic Neg liver ROS,GERD  ,,  Endo/Other  negative endocrine ROS    Renal/GU negative Renal ROS  negative genitourinary   Musculoskeletal   Abdominal   Peds  Hematology negative hematology ROS (+)   Anesthesia Other Findings   Reproductive/Obstetrics negative OB ROS                              Anesthesia Physical Anesthesia Plan  ASA: 2  Anesthesia Plan: General and General LMA   Post-op Pain Management:    Induction:   PONV Risk Score and Plan: Ondansetron   Airway Management Planned:   Additional Equipment:   Intra-op Plan:   Post-operative Plan:   Informed Consent: I have reviewed the patients History and Physical, chart, labs and discussed the procedure including the risks, benefits and alternatives for the proposed anesthesia with the patient or authorized representative who has indicated his/her understanding and acceptance.     Dental Advisory Given  Plan Discussed with: CRNA  Anesthesia Plan Comments:         Anesthesia Quick Evaluation  "

## 2024-11-06 ENCOUNTER — Encounter (HOSPITAL_COMMUNITY): Payer: Self-pay | Admitting: Orthopedic Surgery

## 2024-11-09 NOTE — Anesthesia Postprocedure Evaluation (Signed)
"   Anesthesia Post Note  Patient: [name]  Procedure(s) Performed: OPEN REDUCTION INTERNAL FIXATION (ORIF) DISTAL RADIUS FRACTURE (Right: Arm Lower)  Patient location during evaluation: Phase II Anesthesia Type: General Level of consciousness: awake Pain management: pain level controlled Vital Signs Assessment: post-procedure vital signs reviewed and stable Respiratory status: spontaneous breathing and respiratory function stable Cardiovascular status: blood pressure returned to baseline and stable Postop Assessment: no headache and no apparent nausea or vomiting Anesthetic complications: no Comments: Late entry   No notable events documented.   Last Vitals:  Vitals:   11/05/24 1017 11/05/24 1148  BP: 110/63 105/63  Pulse:  70  Resp: 18 18  Temp: 36.9 C 37 C  SpO2: 100% 96%    Last Pain:  Vitals:   11/06/24 1403  TempSrc:   PainSc: 2                  Yvonna PARAS Mignonne Afonso      "

## 2024-11-16 ENCOUNTER — Ambulatory Visit: Payer: MEDICAID | Admitting: Orthopedic Surgery

## 2024-11-16 ENCOUNTER — Other Ambulatory Visit: Payer: MEDICAID

## 2024-11-16 ENCOUNTER — Encounter: Payer: Self-pay | Admitting: Orthopedic Surgery

## 2024-11-16 DIAGNOSIS — S52531D Colles' fracture of right radius, subsequent encounter for closed fracture with routine healing: Secondary | ICD-10-CM

## 2024-11-16 DIAGNOSIS — S52531A Colles' fracture of right radius, initial encounter for closed fracture: Secondary | ICD-10-CM | POA: Diagnosis not present

## 2024-11-16 NOTE — Progress Notes (Signed)
 Orthopaedic Postop Note  Assessment: [name] is a 34 y.o. male s/p ORIF of right distal radius fracture  DOS: 11/05/2024  Plan: Splint removed.  Sutures trimmed.  Steri-Strips placed Radiographs stable Procedure discussed Urged him to continue working on gentle range of motion of his fingers. Refill of pain medication provided. Elevate the hand to help with swelling Follow-up in 2 weeks  Cast application - Right short arm cast   Verbal consent was obtained and the correct extremity was identified. A well padded, appropriately molded short arm cast was applied to the Right arm Fingers remained warm and well perfused.   There were no sharp edges Patient tolerated the procedure well Cast care instructions were provided     Follow-up: Return in about 2 weeks (around 11/30/2024). XR at next visit: Right wrist  Subjective:  Chief Complaint  Patient presents with   Routine Post Op    R wrist DOS 11/05/24    History of Present Illness: [name] is a 34 y.o. male who presents following the above stated procedure.  Surgery was almost 2 weeks ago.  He is doing well.  He is taking pain medications occasionally.  He states it is painful in the morning.  He has limited motion in the right thumb, but is able to move it.  He notes occasional pains when he tries to move his fingers.  He had 1 incident where he had an episode of sharp pain with movement of his fingers, with some numbness and tingling which persisted for about 30 minutes.  No issues today.  Review of Systems: No fevers or chills No numbness or tingling No Chest Pain No shortness of breath   Objective: There were no vitals taken for this visit.  Physical Exam:  Alert and oriented.  No acute distress  Volar base right wrist incision is healing well.  No surrounding erythema or drainage.  Sensation is intact throughout his fingers.  Sensation intact in the first dorsal webspace.  He is able to wiggle his  fingers.  Minimal swelling.  No bruising around the surgical incision.  IMAGING: I personally ordered and reviewed the following images:  X-rays of the right wrist were obtained in clinic today.  These are compared to prior x-rays.  There has been no change in overall alignment since surgery.  Plate and screw construct remains intact.  Screws are not backing out.  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  No bony lesion.  Impression: Stable right wrist following ORIF without hardware failure   Miguel DELENA Horde, MD 11/16/2024 11:27 AM

## 2024-11-16 NOTE — Patient Instructions (Signed)

## 2024-11-20 ENCOUNTER — Encounter: Payer: MEDICAID | Admitting: Orthopedic Surgery

## 2024-11-20 ENCOUNTER — Telehealth: Payer: Self-pay | Admitting: Orthopedic Surgery

## 2024-11-20 NOTE — Telephone Encounter (Signed)
 Dr. Onesimo pt - spoke w/the pt, he stated he was seen Friday, 11/16/24 and that something was supposed to be sent in to the pharmacy for him and they don't have it.  He uses [facility] on [address].

## 2024-11-21 MED ORDER — OXYCODONE HCL 5 MG PO TABS: 5.0000 mg | ORAL_TABLET | Freq: Four times a day (QID) | ORAL | 0 refills | Status: AC | PRN

## 2024-11-21 NOTE — Addendum Note (Signed)
 Addended by: ONESIMO ANES A on: 11/21/2024 08:33 AM   Modules accepted: Orders

## 2024-11-30 ENCOUNTER — Other Ambulatory Visit: Payer: Self-pay

## 2024-11-30 ENCOUNTER — Encounter: Payer: Self-pay | Admitting: Orthopedic Surgery

## 2024-11-30 ENCOUNTER — Ambulatory Visit: Payer: MEDICAID | Admitting: Orthopedic Surgery

## 2024-11-30 DIAGNOSIS — S52531D Colles' fracture of right radius, subsequent encounter for closed fracture with routine healing: Secondary | ICD-10-CM

## 2024-11-30 NOTE — Progress Notes (Signed)
 Orthopaedic Postop Note  Assessment: [name] is a 34 y.o. male s/p ORIF of right distal radius fracture  DOS: 11/05/2024  Plan: [name] is doing very well.  Occasional  ibuprofen for pain.  Radiographs remain stable.  He tolerates gentle range of motion in clinic today.  He will transition to a removable wrist brace.  I provided him with home exercises to complete.  I stressed the importance of focusing on range of motion at this time.  He should wear the brace at all times with exception of exercises and hygiene for the next 2 weeks.  He can then start to transition out of the brace.  I would like to see him back in approximately 3 weeks.   Follow-up: Return in about 3 weeks (around 12/21/2024). XR at next visit: Right wrist  Subjective:  Chief Complaint  Patient presents with   Routine Post Op    R Wrist DOS 11/05/24    History of Present Illness: [name] is a 34 y.o. male who presents following the above stated procedure.  Surgery was approximately 4 weeks ago.  He has done well.  He is no longer taking pain medication.  He occasionally takes ibuprofen in the morning or at nighttime.  He does have a little bit of numbness over the volar aspect of the thumb, which he feels is improving.  He has been working on finger range of motion.   Review of Systems: No fevers or chills No numbness or tingling No Chest Pain No shortness of breath   Objective: There were no vitals taken for this visit.  Physical Exam:  Alert and oriented.  No acute distress  Surgical incision is healing well.  No surrounding erythema or drainage.  Decreased sensation to the thenar eminence area.  He tolerates gentle range of motion, including flexion and extension.  He is able to make a fist.  No power grip.  Fingers are warm and well-perfused.   IMAGING: I personally ordered and reviewed the following images:  X-rays right wrist were obtained in clinic today.  No acute injuries are  noted.  Distal radius fracture remains in stable alignment.  Hardware is intact.  Screws are not backing out.  There has been no subsidence through the fracture.  No hardware failure.  No bony lesions.  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  Impression: Stable right distal radius fracture, following ORIF without hardware failure or subsidence   Miguel DELENA Horde, MD 11/30/2024 10:29 AM

## 2024-11-30 NOTE — Patient Instructions (Signed)
Wrist Fracture Rehab Ask your health care provider which exercises are safe for you. Do exercises exactly as told by your health care provider and adjust them as directed. It is normal to feel mild stretching, pulling, tightness, or discomfort as you do these exercises. Stop right away if you feel sudden pain or your pain gets worse. Do not begin these exercises until told by your health care provider. Stretching and range-of-motion exercises These exercises warm up your muscles and joints and improve the movement and flexibility of your wrist and hand. These exercises also help to relieve pain,numbness, and tingling. Finger flexion and extension Sit or stand with your elbow at your side. Open and stretch your left / right fingers as wide as you can (extension). Hold this position for 10 seconds. Close your left / right fingers into a gentle fist (flexion). Hold this position for 10 seconds. Slowly return to the starting position. Repeat 10 times. Complete this exercise 1-2 times a day. Wrist flexion Bend your left / right elbow to a 90-degree angle (right angle) with your palm facing the floor. Bend your wrist forward so your fingers point toward the floor (flexion). Hold this position for 10 seconds. Slowly return to the starting position. Repeat 10 times. Complete this exercise 1-2 times a day. Wrist extension Bend your left / right elbow to a 90-degree angle (right angle) with your palm facing the floor. Bend your wrist backward so your fingers point toward the ceiling (extension). Hold this position for 10 seconds. Slowly return to the starting position. Repeat 10 times. Complete this exercise 1-2 times a day. Ulnar deviation Bend your left / right elbow to a 90-degree angle (right angle), and rest your forearm on a table with your palm facing down. Keeping your hand flat on the table, bend your left / right wrist toward your small finger (pinkie). This is ulnar deviation. Hold this  position for 10 seconds. Slowly return to the starting position. Repeat 10 times. Complete this exercise 1-2 times a day. Radial deviation Bend your left / right elbow to a 90-degree angle (right angle), and rest your forearm on a table with your palm facing down. Keeping your hand flat on the table, bend your left / right wrist toward your thumb. This is radial deviation. Hold this position for 10 seconds. Slowly return to the starting position. Repeat 10 times. Complete this exercise 1-2 times a day. Forearm rotation, supination Stand or sit with your left / right elbow bent to a 90-degree angle (right angle) at your side. Position your forearm so that the thumb is facing the ceiling (neutral position). Turn (rotate) your palm up toward the ceiling (supination), stopping when you feel a gentle stretch. Hold this position for 10 seconds. Slowly return to the starting position. Repeat 10 times. Complete this exercise 1-2 times a day. Forearm rotation, pronation Stand or sit with your left / right elbow bent to a 90-degree angle (right angle) at your side. Position your forearm so that the thumb is facing the ceiling (neutral position). Turn (rotate) your palm down toward the floor (pronation), stopping when you feel a gentle stretch. Hold this position for 10 seconds. Slowly return to the starting position. Repeat 10 times. Complete this exercise 1-2 times a day. Wrist flexion stretch  Extend your left / right arm in front of you and turn your palm down toward the floor. If told by your health care provider, bend your left / right arm to a 90-degree angle (  right angle) at your side. Using your uninjured hand, gently press over the back of your left / right hand to bend your wrist and fingers toward the floor (flexion). Go as far as you can to feel a stretch without causing pain. Hold this position for 10 seconds. Slowly return to the starting position. Repeat 10 times. Complete this  exercise 1-2 times a day. Wrist extension stretch  Extend your left / right arm in front of you and turn your palm up toward the ceiling. If told by your health care provider, bend your left / right arm to a 90-degree angle (right angle) at your side. Using your uninjured hand, gently press over the palm of your left / right hand to bend your wrist and fingers toward the floor (extension). Go as far as you can to feel a stretch without causing pain. Hold this position for 10 seconds. Slowly return to the starting position. Repeat 10 times. Complete this exercise 1-2 times a day. Forearm rotation stretch, supination Stand or sit with your arms at your sides. Bend your left / right elbow to a 90-degree angle (right angle). Using your uninjured hand, turn your left / right palm up toward the ceiling (assisted supination) until you feel a gentle stretch in the inside of your forearm. Hold this position for 10 seconds. Slowly return to the starting position. Repeat 10 times. Complete this exercise 1-2 times a day. Forearm rotation stretch, pronation Stand or sit with your arms at your sides. Bend your left / right elbow to a 90-degree angle (right angle). Using your uninjured hand, turn your left / right palm down toward the floor (assisted pronation) until you feel a gentle stretch in the top of your forearm. Hold this position for 10 seconds. Slowly return to the starting position. Repeat 10 times. Complete this exercise 1-2 times a day. Strengthening exercises These exercises build strength and endurance in your wrist and hand. Enduranceis the ability to use your muscles for a long time, even after they get tired. Wrist flexion Sit with your left / right forearm supported on a table. Your elbow should be at waist height. Rest your hand over the edge of the table, palm up. Gently grasp a 5 lb / kg weight (can of soup). Or, hold an exercise band or tube in both hands, keeping your hands at  the same level and hip distance apart. There should be slight tension in the exercise band or tube. Without moving your forearm or elbow, slowly bend your wrist up toward the ceiling (wrist flexion). Hold this position for 10 seconds. Slowly return to the starting position. Repeat 10 times. Complete this exercise 1-2 times a day. Wrist extension Sit with your left / right forearm supported on a table. Your elbow should be at waist height. Rest your hand over the edge of the table, palm down. Gently grasp a 5 lb / kg weight. Or, hold an exercise band or tube in both hands, keeping your hands at the same level and hip distance apart. There should be slight tension in the exercise band or tube. Without moving your forearm or elbow, slowly curl your hand up toward the ceiling (extension). Hold this position for 10 seconds. Slowly return to the starting position. Repeat 10 times. Complete this exercise 1-2 times a day. Forearm rotation, supination  Sit with your left / right forearm supported on a table. Your elbow should be at waist height. Rest your hand over the edge of the  table, palm down. Gently grasp a lightweight hammer near the head. As this exercise gets easier for you, try holding the hammer farther down the handle. Without moving your elbow, slowly turn (rotate) your palm up toward the ceiling (supination). Hold this position for 10 seconds. Slowly return to the starting position. Repeat 10 times. Complete this exercise 1-2 times a day. Forearm rotation, pronation  Sit with your left / right forearm supported on a table. Your elbow should be at waist height. Rest your hand over the edge of the table, palm up. Gently grasp a lightweight hammer near the head. As this exercise gets easier for you, try holding the hammer farther down the handle. Without moving your elbow, slowly turn (rotate) your palm down toward the floor (pronation). Hold this position for 10 seconds. Slowly return  to the starting position. Repeat 10 times. Complete this exercise 1-2 times a day. Grip strengthening  Hold one of these items in your left / right hand: a dense sponge, a stress ball, or a large, rolled sock. Slowly squeeze the object as hard as you can without increasing any pain. Hold your squeeze for 10 seconds. Slowly release your grip. Repeat 10 times. Complete this exercise 1-2 times a day. This information is not intended to replace advice given to you by your health care provider. Make sure you discuss any questions you have with your healthcare provider. Document Revised: 02/21/2020 Document Reviewed: 02/21/2020 [employer] Patient Education  [employer].

## 2024-12-21 ENCOUNTER — Encounter: Payer: MEDICAID | Admitting: Orthopedic Surgery
